# Patient Record
Sex: Female | Born: 1960 | Race: White | Hispanic: No | Marital: Married | Smoking: Never smoker
Health system: Southern US, Community
[De-identification: ages and names within clinical notes are randomized; demographics above are authoritative.]

## PROBLEM LIST (undated history)

## (undated) DIAGNOSIS — I10 Essential (primary) hypertension: Secondary | ICD-10-CM

## (undated) DIAGNOSIS — T7840XA Allergy, unspecified, initial encounter: Secondary | ICD-10-CM

## (undated) DIAGNOSIS — R51 Headache: Secondary | ICD-10-CM

## (undated) DIAGNOSIS — Z87440 Personal history of urinary (tract) infections: Secondary | ICD-10-CM

## (undated) DIAGNOSIS — D649 Anemia, unspecified: Secondary | ICD-10-CM

## (undated) DIAGNOSIS — I4891 Unspecified atrial fibrillation: Secondary | ICD-10-CM

## (undated) DIAGNOSIS — E785 Hyperlipidemia, unspecified: Secondary | ICD-10-CM

## (undated) DIAGNOSIS — F419 Anxiety disorder, unspecified: Secondary | ICD-10-CM

## (undated) DIAGNOSIS — E049 Nontoxic goiter, unspecified: Secondary | ICD-10-CM

## (undated) DIAGNOSIS — K219 Gastro-esophageal reflux disease without esophagitis: Secondary | ICD-10-CM

## (undated) DIAGNOSIS — M1990 Unspecified osteoarthritis, unspecified site: Secondary | ICD-10-CM

## (undated) DIAGNOSIS — M4800 Spinal stenosis, site unspecified: Secondary | ICD-10-CM

## (undated) HISTORY — DX: Allergy, unspecified, initial encounter: T78.40XA

## (undated) HISTORY — DX: Anxiety disorder, unspecified: F41.9

## (undated) HISTORY — DX: Spinal stenosis, site unspecified: M48.00

## (undated) HISTORY — PX: OTHER SURGICAL HISTORY: SHX169

## (undated) HISTORY — DX: Essential (primary) hypertension: I10

## (undated) HISTORY — DX: Gastro-esophageal reflux disease without esophagitis: K21.9

## (undated) HISTORY — DX: Unspecified atrial fibrillation: I48.91

## (undated) HISTORY — DX: Personal history of urinary (tract) infections: Z87.440

## (undated) HISTORY — DX: Nontoxic goiter, unspecified: E04.9

## (undated) HISTORY — DX: Headache: R51

## (undated) HISTORY — DX: Hyperlipidemia, unspecified: E78.5

## (undated) HISTORY — DX: Anemia, unspecified: D64.9

## (undated) HISTORY — DX: Unspecified osteoarthritis, unspecified site: M19.90

---

## 1997-11-04 ENCOUNTER — Ambulatory Visit (HOSPITAL_COMMUNITY): Admission: RE | Admit: 1997-11-04 | Discharge: 1997-11-04 | Payer: Self-pay | Admitting: Obstetrics and Gynecology

## 1998-02-04 ENCOUNTER — Ambulatory Visit (HOSPITAL_COMMUNITY): Admission: RE | Admit: 1998-02-04 | Discharge: 1998-02-04 | Payer: Self-pay | Admitting: Obstetrics and Gynecology

## 1998-03-31 ENCOUNTER — Ambulatory Visit (HOSPITAL_COMMUNITY): Admission: RE | Admit: 1998-03-31 | Discharge: 1998-03-31 | Payer: Self-pay | Admitting: Obstetrics and Gynecology

## 1998-04-06 ENCOUNTER — Ambulatory Visit (HOSPITAL_COMMUNITY): Admission: RE | Admit: 1998-04-06 | Discharge: 1998-04-06 | Payer: Self-pay | Admitting: Obstetrics and Gynecology

## 1998-04-08 ENCOUNTER — Ambulatory Visit (HOSPITAL_COMMUNITY): Admission: RE | Admit: 1998-04-08 | Discharge: 1998-04-08 | Payer: Self-pay | Admitting: Obstetrics and Gynecology

## 1998-07-01 ENCOUNTER — Other Ambulatory Visit: Admission: RE | Admit: 1998-07-01 | Discharge: 1998-07-01 | Payer: Self-pay | Admitting: Obstetrics and Gynecology

## 1999-09-23 ENCOUNTER — Other Ambulatory Visit: Admission: RE | Admit: 1999-09-23 | Discharge: 1999-09-23 | Payer: Self-pay | Admitting: Obstetrics and Gynecology

## 2000-12-06 ENCOUNTER — Other Ambulatory Visit: Admission: RE | Admit: 2000-12-06 | Discharge: 2000-12-06 | Payer: Self-pay | Admitting: Obstetrics and Gynecology

## 2001-07-25 ENCOUNTER — Encounter: Payer: Self-pay | Admitting: Family Medicine

## 2001-07-25 ENCOUNTER — Ambulatory Visit (HOSPITAL_COMMUNITY): Admission: RE | Admit: 2001-07-25 | Discharge: 2001-07-25 | Payer: Self-pay | Admitting: Family Medicine

## 2001-12-24 ENCOUNTER — Other Ambulatory Visit: Admission: RE | Admit: 2001-12-24 | Discharge: 2001-12-24 | Payer: Self-pay | Admitting: Obstetrics and Gynecology

## 2002-12-31 ENCOUNTER — Other Ambulatory Visit: Admission: RE | Admit: 2002-12-31 | Discharge: 2002-12-31 | Payer: Self-pay | Admitting: Obstetrics and Gynecology

## 2004-02-22 ENCOUNTER — Other Ambulatory Visit: Admission: RE | Admit: 2004-02-22 | Discharge: 2004-02-22 | Payer: Self-pay | Admitting: Obstetrics and Gynecology

## 2004-08-07 ENCOUNTER — Emergency Department (HOSPITAL_COMMUNITY): Admission: EM | Admit: 2004-08-07 | Discharge: 2004-08-08 | Payer: Self-pay | Admitting: Emergency Medicine

## 2005-03-14 ENCOUNTER — Other Ambulatory Visit: Admission: RE | Admit: 2005-03-14 | Discharge: 2005-03-14 | Payer: Self-pay | Admitting: Obstetrics and Gynecology

## 2005-09-12 ENCOUNTER — Ambulatory Visit: Payer: Self-pay | Admitting: Internal Medicine

## 2006-06-29 ENCOUNTER — Ambulatory Visit: Payer: Self-pay | Admitting: Internal Medicine

## 2006-06-29 ENCOUNTER — Ambulatory Visit: Payer: Self-pay

## 2006-09-19 ENCOUNTER — Ambulatory Visit: Payer: Self-pay | Admitting: Internal Medicine

## 2006-09-19 LAB — CONVERTED CEMR LAB
Creatinine, Ser: 0.7 mg/dL (ref 0.4–1.2)
Potassium: 3.8 meq/L (ref 3.5–5.1)

## 2007-05-07 ENCOUNTER — Ambulatory Visit: Payer: Self-pay | Admitting: Internal Medicine

## 2007-05-08 LAB — CONVERTED CEMR LAB
ALT: 28 units/L (ref 0–35)
BUN: 14 mg/dL (ref 6–23)
Bilirubin, Direct: 0.1 mg/dL (ref 0.0–0.3)
Calcium: 9.3 mg/dL (ref 8.4–10.5)
Eosinophils Absolute: 0.2 10*3/uL (ref 0.0–0.6)
GFR calc Af Amer: 98 mL/min
GFR calc non Af Amer: 81 mL/min
Glucose, Bld: 101 mg/dL — ABNORMAL HIGH (ref 70–99)
HDL: 39.3 mg/dL (ref >39.0)
Hgb A1c MFr Bld: 5.6 % (ref 4.6–6.0)
Lymphocytes Relative: 22 % (ref 12.0–46.0)
MCHC: 33.9 g/dL (ref 30.0–36.0)
MCV: 84.8 fL (ref 78.0–100.0)
Monocytes Relative: 5.9 % (ref 3.0–11.0)
Neutro Abs: 5.2 10*3/uL (ref 1.4–7.7)
Platelets: 320 10*3/uL (ref 150–400)
Saturation Ratios: 8 % — ABNORMAL LOW (ref 20.0–50.0)
Triglycerides: 160 mg/dL — ABNORMAL HIGH (ref 0–149)

## 2007-07-25 HISTORY — PX: OTHER SURGICAL HISTORY: SHX169

## 2008-05-05 ENCOUNTER — Ambulatory Visit: Payer: Self-pay | Admitting: Internal Medicine

## 2008-05-05 LAB — CONVERTED CEMR LAB
Albumin: 3.9 g/dL (ref 3.5–5.2)
Alkaline Phosphatase: 66 units/L (ref 39–117)
BUN: 15 mg/dL (ref 6–23)
Calcium: 9.1 mg/dL (ref 8.4–10.5)
Creatinine, Ser: 0.7 mg/dL (ref 0.4–1.2)
Creatinine,U: 110 mg/dL
GFR calc Af Amer: 114 mL/min
GFR calc non Af Amer: 95 mL/min
Microalb Creat Ratio: 174.5 mg/g — ABNORMAL HIGH (ref 0.0–30.0)
Microalb, Ur: 19.2 mg/dL — ABNORMAL HIGH (ref 0.0–1.9)
Potassium: 4.4 meq/L (ref 3.5–5.1)

## 2008-10-02 ENCOUNTER — Ambulatory Visit: Payer: Self-pay | Admitting: Internal Medicine

## 2008-10-02 LAB — CONVERTED CEMR LAB
ALT: 29 units/L (ref 0–35)
AST: 22 units/L (ref 0–37)
Eosinophils Relative: 3.9 % (ref 0.0–5.0)
Iron: 75 ug/dL (ref 42–145)
Lymphocytes Relative: 23.5 % (ref 12.0–46.0)
Monocytes Relative: 7.6 % (ref 3.0–12.0)
Neutrophils Relative %: 64.7 % (ref 43.0–77.0)
Platelets: 283 10*3/uL (ref 150–400)
WBC: 7.9 10*3/uL (ref 4.5–10.5)

## 2008-12-17 ENCOUNTER — Encounter: Payer: Self-pay | Admitting: Internal Medicine

## 2009-01-11 ENCOUNTER — Encounter: Payer: Self-pay | Admitting: Internal Medicine

## 2009-01-12 ENCOUNTER — Ambulatory Visit: Payer: Self-pay | Admitting: Internal Medicine

## 2009-01-12 DIAGNOSIS — E785 Hyperlipidemia, unspecified: Secondary | ICD-10-CM | POA: Insufficient documentation

## 2009-01-12 DIAGNOSIS — Z780 Asymptomatic menopausal state: Secondary | ICD-10-CM | POA: Insufficient documentation

## 2009-01-12 DIAGNOSIS — J309 Allergic rhinitis, unspecified: Secondary | ICD-10-CM | POA: Insufficient documentation

## 2009-01-12 DIAGNOSIS — R51 Headache: Secondary | ICD-10-CM | POA: Insufficient documentation

## 2009-01-12 DIAGNOSIS — D649 Anemia, unspecified: Secondary | ICD-10-CM | POA: Insufficient documentation

## 2009-01-12 DIAGNOSIS — R519 Headache, unspecified: Secondary | ICD-10-CM | POA: Insufficient documentation

## 2009-01-12 DIAGNOSIS — I10 Essential (primary) hypertension: Secondary | ICD-10-CM | POA: Insufficient documentation

## 2009-01-12 DIAGNOSIS — K219 Gastro-esophageal reflux disease without esophagitis: Secondary | ICD-10-CM | POA: Insufficient documentation

## 2009-01-12 DIAGNOSIS — M1990 Unspecified osteoarthritis, unspecified site: Secondary | ICD-10-CM | POA: Insufficient documentation

## 2009-01-12 LAB — CONVERTED CEMR LAB
HDL goal, serum: 40 mg/dL
LDL Goal: 130 mg/dL

## 2009-01-20 DIAGNOSIS — E049 Nontoxic goiter, unspecified: Secondary | ICD-10-CM | POA: Insufficient documentation

## 2009-02-17 ENCOUNTER — Encounter: Admission: RE | Admit: 2009-02-17 | Discharge: 2009-02-17 | Payer: Self-pay | Admitting: Internal Medicine

## 2009-02-23 ENCOUNTER — Other Ambulatory Visit: Admission: RE | Admit: 2009-02-23 | Discharge: 2009-02-23 | Payer: Self-pay | Admitting: Interventional Radiology

## 2009-02-23 ENCOUNTER — Encounter (INDEPENDENT_AMBULATORY_CARE_PROVIDER_SITE_OTHER): Payer: Self-pay | Admitting: Interventional Radiology

## 2009-02-23 ENCOUNTER — Encounter: Admission: RE | Admit: 2009-02-23 | Discharge: 2009-02-23 | Payer: Self-pay | Admitting: Internal Medicine

## 2009-03-01 ENCOUNTER — Encounter: Payer: Self-pay | Admitting: Internal Medicine

## 2009-03-01 ENCOUNTER — Telehealth: Payer: Self-pay | Admitting: *Deleted

## 2009-03-11 ENCOUNTER — Ambulatory Visit: Payer: Self-pay | Admitting: Internal Medicine

## 2009-03-15 ENCOUNTER — Ambulatory Visit: Payer: Self-pay | Admitting: Internal Medicine

## 2009-03-15 DIAGNOSIS — L03317 Cellulitis of buttock: Secondary | ICD-10-CM | POA: Insufficient documentation

## 2009-03-15 DIAGNOSIS — L0231 Cutaneous abscess of buttock: Secondary | ICD-10-CM | POA: Insufficient documentation

## 2009-03-17 LAB — CONVERTED CEMR LAB
ALT: 26 units/L (ref 0–35)
Albumin: 3.7 g/dL (ref 3.5–5.2)
HDL: 43.5 mg/dL (ref >39.00)
Total Bilirubin: 0.8 mg/dL (ref 0.3–1.2)
Total CHOL/HDL Ratio: 4
Triglycerides: 133 mg/dL (ref 0.0–149.0)
VLDL: 26.6 mg/dL (ref 0.0–40.0)

## 2009-03-29 ENCOUNTER — Encounter: Payer: Self-pay | Admitting: Internal Medicine

## 2009-04-14 ENCOUNTER — Ambulatory Visit (HOSPITAL_COMMUNITY): Admission: RE | Admit: 2009-04-14 | Discharge: 2009-04-14 | Payer: Self-pay | Admitting: Endocrinology

## 2009-07-09 ENCOUNTER — Ambulatory Visit: Payer: Self-pay | Admitting: Internal Medicine

## 2009-07-09 LAB — CONVERTED CEMR LAB
ALT: 30 units/L (ref 0–35)
AST: 21 units/L (ref 0–37)
Albumin: 4.2 g/dL (ref 3.5–5.2)
Bilirubin, Direct: 0.1 mg/dL (ref 0.0–0.3)
CO2: 25 meq/L (ref 19–32)
Calcium: 9.4 mg/dL (ref 8.4–10.5)
Cholesterol: 182 mg/dL (ref 0–200)
Creatinine, Urine: 95.6 mg/dL
Glucose, Bld: 102 mg/dL — ABNORMAL HIGH (ref 70–99)
HDL: 46 mg/dL (ref >39)
Potassium: 4.8 meq/L (ref 3.5–5.3)
Sodium: 140 meq/L (ref 135–145)
Total CHOL/HDL Ratio: 4
Total Protein, Urine: 66
Total Protein: 7 g/dL (ref 6.0–8.3)
VLDL: 33 mg/dL (ref 0–40)

## 2009-07-13 ENCOUNTER — Ambulatory Visit: Payer: Self-pay | Admitting: Internal Medicine

## 2009-07-13 LAB — CONVERTED CEMR LAB: T4, Total: 8.2 ug/dL (ref 5.0–12.5)

## 2009-07-14 ENCOUNTER — Encounter: Payer: Self-pay | Admitting: Internal Medicine

## 2009-07-14 ENCOUNTER — Encounter (INDEPENDENT_AMBULATORY_CARE_PROVIDER_SITE_OTHER): Payer: Self-pay | Admitting: *Deleted

## 2009-09-27 ENCOUNTER — Encounter: Admission: RE | Admit: 2009-09-27 | Discharge: 2009-09-27 | Payer: Self-pay | Admitting: Internal Medicine

## 2009-10-07 ENCOUNTER — Encounter: Payer: Self-pay | Admitting: Internal Medicine

## 2009-10-13 ENCOUNTER — Encounter: Payer: Self-pay | Admitting: Internal Medicine

## 2009-11-26 ENCOUNTER — Encounter: Payer: Self-pay | Admitting: *Deleted

## 2009-12-29 ENCOUNTER — Encounter: Admission: RE | Admit: 2009-12-29 | Discharge: 2009-12-29 | Payer: Self-pay | Admitting: Endocrinology

## 2010-04-06 ENCOUNTER — Encounter: Admission: RE | Admit: 2010-04-06 | Discharge: 2010-04-06 | Payer: Self-pay | Admitting: Obstetrics and Gynecology

## 2010-07-14 ENCOUNTER — Ambulatory Visit: Payer: Self-pay | Admitting: Internal Medicine

## 2010-07-14 LAB — CONVERTED CEMR LAB
AST: 23 units/L (ref 0–37)
BUN: 18 mg/dL (ref 6–23)
Basophils Absolute: 0 10*3/uL (ref 0.0–0.1)
Calcium: 9.8 mg/dL (ref 8.4–10.5)
Chloride: 106 meq/L (ref 96–112)
Creatinine, Ser: 0.7 mg/dL (ref 0.4–1.2)
Creatinine, Urine: 63.3 mg/dL
Eosinophils Relative: 3.1 % (ref 0.0–5.0)
GFR calc non Af Amer: 101.89 mL/min (ref >60)
HCT: 36.2 % (ref 36.0–46.0)
HDL: 46.6 mg/dL (ref >39.00)
LDL Cholesterol: 94 mg/dL (ref 0–99)
Lymphs Abs: 1.8 10*3/uL (ref 0.7–4.0)
Monocytes Relative: 7.2 % (ref 3.0–12.0)
Neutrophils Relative %: 62.1 % (ref 43.0–77.0)
Platelets: 307 10*3/uL (ref 150.0–400.0)
RDW: 14.4 % (ref 11.5–14.6)
TSH: 0.79 microintl units/mL (ref 0.35–5.50)
Total CHOL/HDL Ratio: 4
Total Protein, Urine: 26
Triglycerides: 138 mg/dL (ref 0.0–149.0)
VLDL: 27.6 mg/dL (ref 0.0–40.0)
WBC: 6.8 10*3/uL (ref 4.5–10.5)

## 2010-07-19 ENCOUNTER — Ambulatory Visit: Payer: Self-pay | Admitting: Internal Medicine

## 2010-09-04 ENCOUNTER — Inpatient Hospital Stay (HOSPITAL_COMMUNITY)
Admission: EM | Admit: 2010-09-04 | Discharge: 2010-09-05 | Payer: Self-pay | Source: Home / Self Care | Attending: Internal Medicine | Admitting: Internal Medicine

## 2010-09-07 ENCOUNTER — Ambulatory Visit (HOSPITAL_COMMUNITY)
Admission: RE | Admit: 2010-09-07 | Discharge: 2010-09-07 | Payer: Self-pay | Source: Home / Self Care | Attending: Cardiology | Admitting: Cardiology

## 2010-09-07 ENCOUNTER — Ambulatory Visit: Admission: RE | Admit: 2010-09-07 | Discharge: 2010-09-07 | Payer: Self-pay | Source: Home / Self Care

## 2010-09-07 ENCOUNTER — Encounter: Payer: Self-pay | Admitting: Internal Medicine

## 2010-09-07 ENCOUNTER — Other Ambulatory Visit: Payer: Self-pay

## 2010-09-16 DIAGNOSIS — I4891 Unspecified atrial fibrillation: Secondary | ICD-10-CM | POA: Insufficient documentation

## 2010-09-19 ENCOUNTER — Ambulatory Visit
Admission: RE | Admit: 2010-09-19 | Discharge: 2010-09-19 | Payer: Self-pay | Source: Home / Self Care | Attending: Internal Medicine | Admitting: Internal Medicine

## 2010-09-19 ENCOUNTER — Encounter: Payer: Self-pay | Admitting: Internal Medicine

## 2010-09-25 ENCOUNTER — Encounter: Payer: Self-pay | Admitting: Endocrinology

## 2010-09-26 ENCOUNTER — Other Ambulatory Visit: Payer: Self-pay

## 2010-09-26 ENCOUNTER — Encounter: Payer: Self-pay | Admitting: Internal Medicine

## 2010-09-26 DIAGNOSIS — Z1239 Encounter for other screening for malignant neoplasm of breast: Secondary | ICD-10-CM

## 2010-09-30 NOTE — Discharge Summary (Signed)
Samantha Boone, Samantha Boone NO.:  [id number]  MEDICAL RECORD NO.:  [id number]          PATIENT TYPE:  INP  LOCATION:  3730                         FACILITY:  [facility]  PHYSICIAN:  Pricilla Riffle, MD, FACCDATE OF BIRTH:  November 01, 1960  DATE OF ADMISSION:  09/04/2010 DATE OF DISCHARGE:  09/05/2010                              DISCHARGE SUMMARY   PRIMARY CARDIOLOGIST:  Pricilla Riffle, MD, [facility]  PRIMARY CARE PHYSICIAN:  Stacie Glaze, MD  PROCEDURES PERFORMED DURING HOSPITALIZATION:  None.  FINAL DISCHARGE DIAGNOSIS:  Paroxysmal atrial fibrillation.  SECONDARY DIAGNOSES: 1. Osteoarthritis. 2. Hypertension. 3. Hyperlipidemia. 4. Gastroesophageal reflux disease. 5. History of goiter.  HOSPITAL COURSE:  This is a 50 year old female who presented to the emergency room with atrial fib with RVR.  It was her first episode of atrial fibrillation.  She was started on a Cardizem drip and was also started on metoprolol for rate control and she was given a bolus of magnesium.  Cardiac markers were cycled and found to be negative.  The patient was followed by Dr. Tenny Craw the following day and she had converted to normal sinus rhythm.  She was started on Cardizem 240 mg daily and metoprolol was discontinued.  The patient was without complaint of chest pain or shortness of breath.  She was not placed on Coumadin at that time per Dr. Tenny Craw that may be considered for same.  She does have a CHADS score of 1 at this time and is therefore being treated with aspirin only.  Echocardiogram was not completed during hospitalization. This can be reevaluated as an outpatient per Dr. Charlott Rakes discretion.  The patient is currently in normal sinus rhythm with stable blood pressure of 106/70.  She will follow up with Dr. Tenny Craw as an outpatient in our clinic within 2 weeks.  We will give her an appointment as this is a weekend and they will call her.  DISCHARGE LABORATORY DATA:  Cholesterol 171,  triglycerides 179, HDL 39, and LDL 96.  Sodium 138, potassium 3.7, chloride 106, CO2 of 25, glucose 99, BUN 16, and creatinine 0.66.  Hemoglobin 12.0, hematocrit 36.5, white blood cells 8.8, and platelets 303.  T4 of 1.14 and TSH is 0.844. Magnesium 2.1.  BNP 163.0.  Chest x-ray dated September 04, 2010, revealing mild cardiomegaly.  VITAL SIGNS ON DISCHARGE:  Blood pressure 106/70, pulse 67, respirations 18, O2 sat 96% on room air, her temperature was 98.1, and weight 132.3 kg.  DISCHARGE MEDICATIONS: 1. Cardizem 240 mg by mouth daily (called in by Dr. Tenny Craw to her     pharmacy). 2. Aspirin 81 mg daily. 3. Black cohosh root extract 40 mg b.i.d. 4. Calcium citrate 1 tablet by mouth b.i.d. 5. Chlor-Trimeton 4 mg by mouth daily as needed. 6. Cozaar 100 mg by mouth b.i.d. 7. Zetia 10 mg by mouth every evening. 8. Flonase nasal spray 1 spray every morning. 9. Glucosamine sulfate/chondroitin 1 tablet by mouth b.i.d. 10.Krill oil 1000 mg capsules by mouth b.i.d. 11.Mobic 15 mg every morning. 12.Multivitamins 1 tablet by mouth daily. 13.Nexium 1 capsule by mouth daily at 40 mg. 14.Pseudoephedrine 30 mg 2 tablets  by mouth every 12 hours p.r.n. 15.Red yeast rice 600 mg my mouth twice daily. 16.Vitamin D2 500 units by mouth on Sundays.  FOLLOWUP PLANS AND APPOINTMENT: 1. The patient will follow up with Dr. Dietrich Pates in the [facility].  She will be called to have this appointment made as this is     a holiday. 2. She will follow up with Dr. Darryll Capers, her primary care     physician for continued medical management. 3. She has been advised to take a new medication, Cardizem which has     been called in. 4. She has been advised to bring all medications to followup     appointment.  Time spent with the patient to include physician time 35 minutes.     [name]. Lyman Bishop, NP   ______________________________ Pricilla Riffle, MD, [facility]    KML/MEDQ  D:  09/05/2010  T:   09/05/2010  Job:  [id number]  cc:   Stacie Glaze, MD  Electronically Signed by Joni Reining NP on 09/06/2010 04:24:31 PM Electronically Signed by Dietrich Pates MD [facility] on 09/30/2010 07:54:49 PM

## 2010-10-06 NOTE — Assessment & Plan Note (Signed)
Summary: 5 month rov/njr pt rsc/njr---PT [facility] // RS PT RSC/NJR/PT RSC/CJR   Vital Signs:  Patient profile:   50 year old female Height:      64.25 inches Weight:      292 pounds BMI:     49.91 Temp:     98.2 degrees F oral Pulse rate:   76 / minute Resp:     14 per minute BP sitting:   142 / 84  (left arm) Cuff size:   large  Vitals Entered By: Willy Eddy, LPN (July 19, 2010 3:51 PM) CC: roa labs, Hypertension Management Is Patient Diabetic? No   CC:  roa labs and Hypertension Management.  Hypertension History:      She denies headache, chest pain, palpitations, dyspnea with exertion, orthopnea, PND, peripheral edema, visual symptoms, neurologic problems, syncope, and side effects from treatment.  She notes no problems with any antihypertensive medication side effects.  has lost weight.        Positive major cardiovascular risk factors include hyperlipidemia and hypertension.  Negative major cardiovascular risk factors include female age less than 37 years old and non-tobacco-user status.     Preventive Screening-Counseling & Management  Alcohol-Tobacco     Smoking Status: never     Tobacco Counseling: not indicated; no tobacco use  Problems Prior to Update: 1)  Cellulitis and Abscess of Buttock  (ICD-682.5) 2)  Goiter  (ICD-240.9) 3)  Weight Gain  (ICD-783.1) 4)  Climacteric State, Female  (ICD-V49.81) 5)  Osteoarthritis  (ICD-715.90) 6)  Hypertension  (ICD-401.9) 7)  Hyperlipidemia  (ICD-272.4) 8)  Headache  (ICD-784.0) 9)  Gerd  (ICD-530.81) 10)  Anemia-nos  (ICD-285.9) 11)  Allergic Rhinitis  (ICD-477.9)  Current Problems (verified): 1)  Cellulitis and Abscess of Buttock  (ICD-682.5) 2)  Goiter  (ICD-240.9) 3)  Weight Gain  (ICD-783.1) 4)  Climacteric State, Female  (ICD-V49.81) 5)  Osteoarthritis  (ICD-715.90) 6)  Hypertension  (ICD-401.9) 7)  Hyperlipidemia  (ICD-272.4) 8)  Headache  (ICD-784.0) 9)  Gerd  (ICD-530.81) 10)  Anemia-nos   (ICD-285.9) 11)  Allergic Rhinitis  (ICD-477.9)  Medications Prior to Update: 1)  Cozaar 100 Mg Tabs (Losartan Potassium) .Marland Kitchen.. 1 Two Times A Dayname Brand Only 2)  Bayer Aspirin Ec Low Dose 81 Mg Tbec (Aspirin) .Marland Kitchen.. 1 Once Daily 3)  Nexium 40 Mg Cpdr (Esomeprazole Magnesium) .Marland Kitchen.. 1 Once Daily 4)  Tums Ultra 1000 1000 Mg Chew (Calcium Carbonate Antacid) .Marland Kitchen.. 1 Two Times A Day 5)  Multivitamins  Caps (Multiple Vitamin) .Marland Kitchen.. 1 Once Daily 6)  Flonase 50 Mcg/act Susp (Fluticasone Propionate) .... Each Nostril Once Daily 7)  Anaprox Ds 550 Mg Tabs (Naproxen Sodium) .... As Needed 8)  Sudafed 30 Mg Tabs (Pseudoephedrine Hcl) .... 2 As Needed 9)  Glucosamine-Chondroitin  Caps (Glucosamine-Chondroit-Vit C-Mn) .Marland KitchenMarland KitchenMarland Kitchen 1500/1200 1 Two Times A Day 10)  D3-50 50000 Unit Caps (Cholecalciferol) .Marland Kitchen.. 1 Every Week 11)  Remifemin 20 Mg Tabs (Black Cohosh) .... One By Mouth Two Times A Day 12)  Krill Oil 1000 Mg Caps (Krill Oil) .Marland Kitchen.. 1 Once Daily 13)  Zetia 10 Mg Tabs (Ezetimibe) .... 1/2 Once Daily 14)  Red Yeast Rice 600 Mg Caps (Red Yeast Rice Extract) .Marland Kitchen.. 1 Two Times A Day 15)  Chlor-Trimeton 4 Mg Tabs (Chlorpheniramine Maleate) .Marland Kitchen.. 1 Once Daily As Needed 16)  Afrin Nasal Spray 0.05 % Soln (Oxymetazoline Hcl) .Marland Kitchen.. 1 Once Daily 17)  Urelle 81 Mg Tabs (Meth-Hyo-M Bl-Na Phos-Ph Sal) .... One By Mouth Q 6  Hour Prn 18)  Doxycycline Hyclate 100 Mg Caps (Doxycycline Hyclate) .Marland Kitchen.. 1 Two Times A Day For 10 Days  Current Medications (verified): 1)  Cozaar 100 Mg Tabs (Losartan Potassium) .Marland Kitchen.. 1 Two Times A Dayname Brand Only 2)  Bayer Aspirin Ec Low Dose 81 Mg Tbec (Aspirin) .Marland Kitchen.. 1 Once Daily 3)  Nexium 40 Mg Cpdr (Esomeprazole Magnesium) .Marland Kitchen.. 1 Once Daily 4)  Multivitamins  Caps (Multiple Vitamin) .Marland Kitchen.. 1 Once Daily 5)  Flonase 50 Mcg/act Susp (Fluticasone Propionate) .... Each Nostril Once Daily 6)  Anaprox Ds 550 Mg Tabs (Naproxen Sodium) .... As Needed 7)  Sudafed 30 Mg Tabs (Pseudoephedrine Hcl) .... 2 As  Needed 8)  Glucosamine-Chondroitin  Caps (Glucosamine-Chondroit-Vit C-Mn) .Marland KitchenMarland KitchenMarland Kitchen 1500/1200 1 Two Times A Day 9)  D3-50 50000 Unit Caps (Cholecalciferol) .Marland Kitchen.. 1 Every Week 10)  Krill Oil 1000 Mg Caps (Krill Oil) .Marland Kitchen.. 1 Once Daily 11)  Zetia 10 Mg Tabs (Ezetimibe) .... 1/2 Once Daily 12)  Red Yeast Rice 600 Mg Caps (Red Yeast Rice Extract) .Marland Kitchen.. 1 Two Times A Day 13)  Chlor-Trimeton 4 Mg Tabs (Chlorpheniramine Maleate) .Marland Kitchen.. 1 Once Daily As Needed 14)  Urelle 81 Mg Tabs (Meth-Hyo-M Bl-Na Phos-Ph Sal) .... One By Mouth Q 6 Hour Prn 15)  Doxycycline Hyclate 100 Mg Caps (Doxycycline Hyclate) .Marland Kitchen.. 1 Two Times A Day For 10 Days 16)  Caltrate 600 1500 Mg Tabs (Calcium Carbonate) .... 2 Once Daily 17)  Meloxicam 15 Mg Tabs (Meloxicam) .... 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  Past Medical History: Reviewed history from 01/12/2009 and no changes required. Allergic rhinitis Anemia-NOS GERD Headache Hyperlipidemia Hypertension Osteoarthritis hx of uti  Past Surgical History: Reviewed history from 01/12/2009 and no changes required. fibroid   removal 1993 uterine ablation 2008  Family History: Reviewed history from 01/12/2009 and no changes required. Family History of Arthritis Family History Hypertension Family History of Prostate CA 1st degree relative <50  Social History: Reviewed history from 01/12/2009 and no changes required. 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         Flu Vaccine Consent Questions     Do you have a history of severe allergic reactions to this vaccine? no    Any prior history of allergic reactions to egg and/or gelatin? no    Do you have a sensitivity to the preservative Thimersol? no    Do you have a past history of Guillan-Barre Syndrome? no    Do you currently have an acute febrile illness? no    Have you ever had a severe reaction to latex? no    Vaccine information given and explained to patient? yes    Are you currently pregnant? no  Lot Number:[id number]   Exp Date:03/04/2011   Site Given  Left Deltoid IM   Physical Exam  General:  alert and well-hydrated.  overweight-appearing.   Eyes:  pupils equal and pupils round.   Nose:  External nasal examination shows no deformity or inflammation. Nasal mucosa are pink and moist without lesions or exudates. Neck:  supple and L neck mass.   Lungs:  normal respiratory effort and normal breath sounds.   Heart:  normal rate and regular rhythm.   Abdomen:  Bowel sounds positive,abdomen soft and non-tender without masses, organomegaly or hernias noted.   Impression & Recommendations:  Problem # 1:  GOITER (ICD-240.9) monitering thyroid  tests and exam  Problem # 2:  HYPERTENSION (ICD-401.9)  up due to the use of naprosyn Her updated medication list for this problem includes:    Cozaar 100 Mg Tabs (Losartan potassium) .Marland Kitchen... 1 two times a dayname brand  only  BP today: 142/84 Prior BP: 126/82 (07/13/2009)  Prior 10 Yr Risk Heart Disease: 8 % (07/13/2009)  Labs Reviewed: K+: 4.8 (07/14/2010) Creat: : 0.7 (07/14/2010)   Chol: 168 (07/14/2010)   HDL: 46.60 (07/14/2010)   LDL: 94 (07/14/2010)   TG: 138.0 (07/14/2010)  Problem # 3:  OSTEOARTHRITIS (ICD-715.90)  Her updated medication list for this problem includes:    Bayer Aspirin Ec Low Dose 81 Mg Tbec (Aspirin) .Marland Kitchen... 1 once daily    Anaprox Ds 550 Mg Tabs (Naproxen sodium) .Marland Kitchen... As needed    Meloxicam 15 Mg Tabs (Meloxicam) ..... One by mouth daily  Discussed use of medications, application of heat or cold, and exercises.   Complete Medication List: 1)  Cozaar 100 Mg Tabs (Losartan potassium) .Marland Kitchen.. 1 two times a dayname brand only 2)  Bayer Aspirin Ec Low Dose 81 Mg Tbec (Aspirin) .Marland Kitchen.. 1 once daily 3)  Nexium 40 Mg Cpdr (Esomeprazole magnesium) .Marland Kitchen.. 1 once daily 4)  Multivitamins Caps (Multiple vitamin) .Marland Kitchen.. 1 once daily 5)  Flonase 50 Mcg/act Susp (Fluticasone propionate) .... Each nostril once daily 6)  Anaprox Ds 550 Mg Tabs (Naproxen sodium) .... As needed 7)  Sudafed 30 Mg Tabs (Pseudoephedrine hcl) .... 2 as needed 8)  Glucosamine-chondroitin Caps (Glucosamine-chondroit-vit c-mn) .Marland KitchenMarland KitchenMarland Kitchen 1500/1200 1 two times a day 9)  D3-50 50000 Unit Caps (Cholecalciferol) .Marland Kitchen.. 1 every week 10)  Krill Oil 1000 Mg Caps (Krill oil) .Marland Kitchen.. 1 once daily 11)  Zetia 10 Mg Tabs (Ezetimibe) .... 1/2 once daily 12)  Red Yeast Rice 600 Mg Caps (Red yeast rice extract) .Marland Kitchen.. 1 two times a day 13)  Chlor-trimeton 4 Mg Tabs (Chlorpheniramine maleate) .Marland Kitchen.. 1 once daily as needed 14)  Urelle 81 Mg Tabs (Meth-hyo-m bl-na phos-ph sal) .... One by mouth q 6 hour prn 15)  Doxycycline Hyclate 100 Mg Caps (Doxycycline hyclate) .Marland Kitchen.. 1 two times a day for 10 days 16)  Caltrate 600 1500 Mg Tabs (Calcium carbonate) .... 2 once daily 17)  Meloxicam 15 Mg Tabs (Meloxicam) .... One by mouth daily  Other Orders: Admin 1st  Vaccine (82956) Flu Vaccine 42yrs + [phone])  Hypertension Assessment/Plan:      The patient's hypertensive risk group is category B: At least one risk factor (excluding diabetes) with no target organ damage.  Her calculated 10 year risk of coronary heart disease is 8 %.  Today's blood pressure is 142/84.  Her blood pressure goal is < 140/90.  Patient Instructions: 1)  Please schedule a follow-up appointment in 6 months. 2)  call to set up  thyroid US prior if  ballan does not  Prescriptions: MELOXICAM 15 MG TABS (MELOXICAM) one by mouth daily  #90 x 3   Entered and Authorized by:   Stacie Glaze MD   Signed by:   Stacie Glaze MD on 07/19/2010   Method used:   Faxed to ...       [facility])             ,          Ph:        Fax: [phone]   RxID:   [id number]    Orders Added: 1)  Admin 1st Vaccine [90471] 2)  Flu Vaccine 82yrs + [31517] 3)  Est. Patient Level IV [61607]

## 2010-10-06 NOTE — Assessment & Plan Note (Addendum)
Summary: eph/a-fib-mb  Medications Added COZAAR 100 MG TABS (LOSARTAN POTASSIUM) Take 1/2 tablet daily ASPIRIN EC 325 MG TBEC (ASPIRIN) Take one tablet by mouth daily DILTIAZEM HCL ER BEADS 240 MG XR24H-CAP (DILTIAZEM HCL ER BEADS) Take one capsule by mouth daily METOPROLOL TARTRATE 25 MG TABS (METOPROLOL TARTRATE) one twice a day      Allergies Added: NKDA  Visit Type:  Initial Consult Primary Provider:  Stacie Glaze MD  CC:  Post-hospital A-fib.  History of Present Illness: Samantha Boone is a 50 year old with a history of hypertension.  She was recently admitted with atrial fibrillation with rapid response.  She was treated with IV dilitazem and converted on her own to SR. She is now on ASA and 240 Diltiazem CD.   SInce D/C she denies palpitations.  She has elim caffeine.  She says her breathing is better on the dilitazem.  BP has been trending up (she is also on Cozaar 50 mg) Echo was done last week and showed normal LV systolic function.  There was mod diastolic dysfunction.  Current Medications (verified): 1)  Cozaar 100 Mg Tabs (Losartan Potassium) .... Take 1/2 Tablet Daily 2)  Aspirin Ec 325 Mg Tbec (Aspirin) .... Take One Tablet By Mouth Daily 3)  Nexium 40 Mg Cpdr (Esomeprazole Magnesium) .Marland Kitchen.. 1 Once Daily 4)  Multivitamins  Caps (Multiple Vitamin) .Marland Kitchen.. 1 Once Daily 5)  Flonase 50 Mcg/act Susp (Fluticasone Propionate) .... Each Nostril Once Daily 6)  Glucosamine-Chondroitin  Caps (Glucosamine-Chondroit-Vit C-Mn) .Marland KitchenMarland KitchenMarland Kitchen 1500/1200 1 Two Times A Day 7)  D3-50 50000 Unit Caps (Cholecalciferol) .Marland Kitchen.. 1 Every Week 8)  Krill Oil 1000 Mg Caps (Krill Oil) .Marland Kitchen.. 1 Once Daily 9)  Zetia 10 Mg Tabs (Ezetimibe) .... 1/2 Once Daily 10)  Red Yeast Rice 600 Mg Caps (Red Yeast Rice Extract) .Marland Kitchen.. 1 Two Times A Day 11)  Chlor-Trimeton 4 Mg Tabs (Chlorpheniramine Maleate) .Marland Kitchen.. 1 Once Daily As Needed 12)  Urelle 81 Mg Tabs (Meth-Hyo-M Bl-Na Phos-Ph Sal) .... One By Mouth Q 6 Hour Prn 13)  Caltrate 600  1500 Mg Tabs (Calcium Carbonate) .... 2 Once Daily 14)  Meloxicam 15 Mg Tabs (Meloxicam) .... One By Mouth Daily 15)  Diltiazem Hcl Er Beads 240 Mg Xr24h-Cap (Diltiazem Hcl Er Beads) .... Take One Capsule By Mouth Daily  Allergies (verified): No Known Drug Allergies  Past History:  Past medical, surgical, family and social histories (including risk factors) reviewed, and no changes noted (except as noted below).  Past Medical History: Reviewed history from 09/16/2010 and no changes required. Allergic rhinitis Anemia-NOS GERD Headache Hyperlipidemia Hypertension Osteoarthritis hx of uti Atrial Fibrillation  Past Surgical History: Reviewed history from 01/12/2009 and no changes required. fibroid   removal 1993 uterine ablation 2008  Family History: Reviewed history from 01/12/2009 and no changes required. Family History of Arthritis Family History Hypertension Family History of Prostate CA 1st degree relative <50  Social History: Reviewed history from 01/12/2009 and no changes required. 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  Neg to the above problme.  Notes that she snores, always has.  Sleeps on stomach  Vital Signs:  Patient profile:   50 year old female Height:      64.25 inches Weight:      289 pounds BMI:     49.40 Pulse rhythm:   regular Resp:     18 per minute BP sitting:   140 /  90  (left arm) Cuff size:   large  Vitals Entered By: Vikki Ports (September 19, 2010 9:28 AM)  Physical Exam  Additional Exam:  Patient is in NAD HEENT:  Normocephalic, atraumatic. EOMI, PERRLA.  Neck: JVP is normal. No thyromegaly. No bruits.  Lungs: clear to auscultation. No rales no wheezes.  Heart: Regular rate and rhythm. Normal S1, S2. No S3.   No significant murmurs. PMI not displaced.  Abdomen:  Supple, nontender. Normal bowel sounds. No masses. No hepatomegaly.  Extremities:   Good distal  pulses throughout. No lower extremity edema.  Musculoskeletal :moving all extremities.  Neuro:   alert and oriented x3.    EKG  Procedure date:  09/19/2010  Findings:      NSR.  74 bpm.    Impression & Recommendations:  Problem # 1:  ATRIAL FIBRILLATION (ICD-427.31) No recurrence.  Continue current regimen  I would add Lopressor to take as needed if breakthrough.  Will also check on Flecanide does to take as needed. Only concern is that sleep apnea may be a trigger.  THis has never been diagonosed.  Will check on home testing.  Problem # 2:  HYPERTENSION (ICD-401.9) WOuld increase Cozaar back to 100mg  per day. Follow.  Again, consider sleep study.  Problem # 3:  HYPERLIPIDEMIA (ICD-272.4) ALso on red yeast rice and krill oil.  Will need to follow. Her updated medication list for this problem includes:    Zetia 10 Mg Tabs (Ezetimibe) .Marland Kitchen... 1/2 once daily  Problem # 4:  WEIGHT GAIN (ICD-783.1) COntinues to work on weight loss.  Other Orders: EKG w/ Interpretation (93000) Prescriptions: METOPROLOL TARTRATE 25 MG TABS (METOPROLOL TARTRATE) one twice a day  #180 x 3   Entered by:   Katina Dung, RN, BSN   Authorized by:   Sherrill Raring, MD, [facility]   Signed by:   Katina Dung, RN, BSN on 09/19/2010   Method used:   Faxed to ...       [facility] ([facility])             , [place]         Ph:        Fax: [phone]   RxID:   [id number] DILTIAZEM HCL ER BEADS 240 MG XR24H-CAP (DILTIAZEM HCL ER BEADS) Take one capsule by mouth daily  #90 x 3   Entered by:   Katina Dung, RN, BSN   Authorized by:   Sherrill Raring, MD, [facility]   Signed by:   Katina Dung, RN, BSN on 09/19/2010   Method used:   Faxed to ...       [facility] ([facility])             , [place]         Ph:        Fax: [phone]   RxID:   [id number]   Appended Document: eph/a-fib-mb Will add Flecanide 150 mg to take if afib recurs.  Can repeat in 2 hrs with an addtional 150 mg if  still in afib.

## 2010-10-06 NOTE — Letter (Signed)
Summary: Generic Letter  [place] [facility]  [address] [place], [place] [address]   Phone: [phone]  Fax: [phone]    11/26/2009  ARACELYS GLADE [address] [place], [place]  [address]    Please Be Advised:  Ms. Heyboer ,juror number [phone], is unable to sit on jury duty due to her hyptertension, which becomes problematic when she is under stress, as she would be if on jury duty.  Ms. Garbutt is also the sole caretaker of her 44 year old mother, and must be available for her at all times.    Please excuse Ms. Berntsen from jury duty.  We can not risk blood pressure elevation which could potenially cause serious illness.  Sincerely,   Dr. Darryll Capers

## 2010-10-06 NOTE — Miscellaneous (Signed)
Summary: Flecainide Rx  Clinical Lists Changes  Medications: Added new medication of FLECAINIDE ACETATE 50 MG TABS (FLECAINIDE ACETATE) take 3 tablets by mouth for AFIB, may repeat 3 tablets by mouth in 2 hours for AFIB - Signed Rx of FLECAINIDE ACETATE 50 MG TABS (FLECAINIDE ACETATE) take 3 tablets by mouth for AFIB, may repeat 3 tablets by mouth in 2 hours for AFIB;  #6 x 1;  Signed;  Entered by: Julieta Gutting, RN, BSN;  Authorized by: Sherrill Raring, MD, [facility];  Method used: Electronically to [facility]*, [address] [place], [place]  [address], Ph: [phone] or [phone], Fax: [phone]    Prescriptions: FLECAINIDE ACETATE 50 MG TABS (FLECAINIDE ACETATE) take 3 tablets by mouth for AFIB, may repeat 3 tablets by mouth in 2 hours for AFIB  #6 x 1   Entered by:   Julieta Gutting, RN, BSN   Authorized by:   Sherrill Raring, MD, [facility]   Signed by:   Julieta Gutting, RN, BSN on 09/26/2010   Method used:   Electronically to        [facility]* (retail)       [address] [place], [place]  [address]       Ph: [phone] or [phone]       Fax: [phone]   RxID:   [id number]

## 2010-10-06 NOTE — Miscellaneous (Signed)
Summary: [facility]  [facility]   Imported By: Maryln Gottron 10/26/2009 10:47:32  _____________________________________________________________________  External Attachment:    Type:   Image     Comment:   External Document

## 2010-10-06 NOTE — Miscellaneous (Signed)
Summary: Orders Update   Clinical Lists Changes  Orders: Added new Referral order of Physical Therapy Referral (PT) - Signed 

## 2010-10-19 ENCOUNTER — Ambulatory Visit
Admission: RE | Admit: 2010-10-19 | Discharge: 2010-10-19 | Disposition: A | Discharge status: Home / Self Care | Payer: BC Managed Care – PPO | Source: Ambulatory Visit | Attending: Internal Medicine | Admitting: Internal Medicine

## 2010-10-19 DIAGNOSIS — Z1239 Encounter for other screening for malignant neoplasm of breast: Secondary | ICD-10-CM

## 2010-11-14 LAB — POCT CARDIAC MARKERS
CKMB, poc: 7.8 ng/mL (ref 1.0–8.0)
Myoglobin, poc: 137 ng/mL (ref 12–200)
Troponin i, poc: <0.05 ng/mL (ref 0.00–0.09)

## 2010-11-14 LAB — BASIC METABOLIC PANEL
BUN: 16 mg/dL (ref 6–23)
CO2: 25 mEq/L (ref 19–32)
Chloride: 106 mEq/L (ref 96–112)
Creatinine, Ser: 0.66 mg/dL (ref 0.4–1.2)

## 2010-11-14 LAB — CBC
HCT: 36.5 % (ref 36.0–46.0)
MCH: 26.6 pg (ref 26.0–34.0)
MCHC: 32.3 g/dL (ref 30.0–36.0)
MCV: 83 fL (ref 78.0–100.0)
Platelets: 303 10*3/uL (ref 150–400)
Platelets: 303 10*3/uL (ref 150–400)
RBC: 4.4 MIL/uL (ref 3.87–5.11)
RBC: 4.73 MIL/uL (ref 3.87–5.11)
WBC: 8.8 10*3/uL (ref 4.0–10.5)

## 2010-11-14 LAB — LIPID PANEL
HDL: 39 mg/dL — ABNORMAL LOW (ref >39)
LDL Cholesterol: 96 mg/dL (ref 0–99)
Triglycerides: 179 mg/dL — ABNORMAL HIGH (ref <150)
VLDL: 36 mg/dL (ref 0–40)

## 2010-11-14 LAB — COMPREHENSIVE METABOLIC PANEL
ALT: 23 U/L (ref 0–35)
AST: 22 U/L (ref 0–37)
Albumin: 3.5 g/dL (ref 3.5–5.2)
Calcium: 9.4 mg/dL (ref 8.4–10.5)
Chloride: 107 mEq/L (ref 96–112)
Creatinine, Ser: 0.75 mg/dL (ref 0.4–1.2)
GFR calc Af Amer: >60 mL/min (ref >60)
Sodium: 142 mEq/L (ref 135–145)

## 2010-11-14 LAB — PROTIME-INR
INR: 0.9 (ref 0.00–1.49)
Prothrombin Time: 13.2 seconds (ref 11.6–15.2)

## 2010-11-14 LAB — TSH: TSH: 0.844 u[IU]/mL (ref 0.350–4.500)

## 2010-11-14 LAB — BRAIN NATRIURETIC PEPTIDE: Pro B Natriuretic peptide (BNP): 163 pg/mL — ABNORMAL HIGH (ref 0.0–100.0)

## 2010-11-14 LAB — CARDIAC PANEL(CRET KIN+CKTOT+MB+TROPI): Relative Index: 2.8 — ABNORMAL HIGH (ref 0.0–2.5)

## 2010-11-28 ENCOUNTER — Other Ambulatory Visit: Payer: Self-pay | Admitting: *Deleted

## 2010-11-28 DIAGNOSIS — L0292 Furuncle, unspecified: Secondary | ICD-10-CM

## 2010-11-28 MED ORDER — DOXYCYCLINE HYCLATE 100 MG PO TABS: 100.0000 mg | ORAL_TABLET | Freq: Two times a day (BID) | ORAL | Status: AC

## 2011-01-02 ENCOUNTER — Other Ambulatory Visit: Payer: Self-pay | Admitting: Endocrinology

## 2011-01-02 DIAGNOSIS — E049 Nontoxic goiter, unspecified: Secondary | ICD-10-CM

## 2011-01-03 MED ORDER — ESOMEPRAZOLE MAGNESIUM 40 MG PO CPDR: 40.0000 mg | DELAYED_RELEASE_CAPSULE | Freq: Every day | ORAL | Status: DC

## 2011-01-06 ENCOUNTER — Encounter: Payer: Self-pay | Admitting: Internal Medicine

## 2011-01-11 ENCOUNTER — Ambulatory Visit (INDEPENDENT_AMBULATORY_CARE_PROVIDER_SITE_OTHER): Payer: BC Managed Care – PPO | Admitting: Internal Medicine

## 2011-01-11 ENCOUNTER — Encounter: Payer: Self-pay | Admitting: Internal Medicine

## 2011-01-11 VITALS — BP 130/84 | HR 64 | Temp 98.2°F | Resp 16 | Ht 63.5 in | Wt 292.0 lb

## 2011-01-11 DIAGNOSIS — E049 Nontoxic goiter, unspecified: Secondary | ICD-10-CM

## 2011-01-11 DIAGNOSIS — I4891 Unspecified atrial fibrillation: Secondary | ICD-10-CM

## 2011-01-11 DIAGNOSIS — I10 Essential (primary) hypertension: Secondary | ICD-10-CM

## 2011-01-11 DIAGNOSIS — R609 Edema, unspecified: Secondary | ICD-10-CM

## 2011-01-11 LAB — BASIC METABOLIC PANEL
BUN: 27 mg/dL — ABNORMAL HIGH (ref 6–23)
CO2: 29 mEq/L (ref 19–32)
GFR: 107.39 mL/min (ref >60.00)
Glucose, Bld: 79 mg/dL (ref 70–99)
Potassium: 4.4 mEq/L (ref 3.5–5.1)

## 2011-01-11 LAB — POCT URINALYSIS DIPSTICK
Bilirubin, UA: NEGATIVE
Blood, UA: NEGATIVE
Glucose, UA: NEGATIVE
Nitrite, UA: NEGATIVE
Spec Grav, UA: 1.02
pH, UA: 5.5

## 2011-01-11 MED ORDER — METOPROLOL SUCCINATE ER 50 MG PO TB24: 50.0000 mg | ORAL_TABLET | Freq: Every day | ORAL | Status: DC

## 2011-01-11 NOTE — Assessment & Plan Note (Signed)
-   Weight loss 

## 2011-01-11 NOTE — Progress Notes (Signed)
Subjective:    Patient ID: Samantha Boone, female    DOB: [birth year], 50 y.o.   MRN: [id number]  HPI Patient seen in the emergency room and subsequently by cardiology for atrial fibrillation with rapid ventricular response she was subsequently brought in to use sinus rhythm using Cardizem but developed edema as a side effect from calcium channel blocker. She was changed to twice a day Toprol to maintain her rhythm after conversion and wishes to try the once a day dose and compliance with a missed p.m. dose.  We will need monitoring of her renal function per the nephrology consultant we will also obtain a urine for spot protein and a basic. metabolic panel She also will need monitoring of her thyroid with a TSH free T3 and a free T4   Review of Systems  Constitutional: Negative for activity change, appetite change and fatigue.  HENT: Negative for ear pain, congestion, neck pain, postnasal drip and sinus pressure.   Eyes: Negative for redness and visual disturbance.  Respiratory: Negative for cough, shortness of breath and wheezing.   Gastrointestinal: Negative for abdominal pain and abdominal distention.  Genitourinary: Negative for dysuria, frequency and menstrual problem.  Musculoskeletal: Negative for myalgias, joint swelling and arthralgias.  Skin: Negative for rash and wound.  Neurological: Negative for dizziness, weakness and headaches.  Hematological: Negative for adenopathy. Does not bruise/bleed easily.  Psychiatric/Behavioral: Negative for sleep disturbance and decreased concentration.       Objective:   Physical Exam  Constitutional: She is oriented to person, place, and time. She appears well-developed and well-nourished. No distress.  HENT:  Head: Normocephalic and atraumatic.  Right Ear: External ear normal.  Left Ear: External ear normal.  Nose: Nose normal.  Mouth/Throat: Oropharynx is clear and moist.  Eyes: Conjunctivae and EOM are normal. Pupils are equal,  round, and reactive to light.  Neck: Normal range of motion. Neck supple. No JVD present. No tracheal deviation present. No thyromegaly present.  Cardiovascular: Normal rate, regular rhythm, normal heart sounds and intact distal pulses.   No murmur heard. Pulmonary/Chest: Effort normal and breath sounds normal. She has no wheezes. She exhibits no tenderness.  Abdominal: Soft. Bowel sounds are normal.  Musculoskeletal: Normal range of motion. She exhibits no edema and no tenderness.  Lymphadenopathy:    She has no cervical adenopathy.  Neurological: She is alert and oriented to person, place, and time. She has normal reflexes. No cranial nerve deficit.  Skin: Skin is warm and dry. She is not diaphoretic.  Psychiatric: She has a normal mood and affect. Her behavior is normal.        Past Medical History  Diagnosis Date  . Allergy   . Anemia   . GERD (gastroesophageal reflux disease)   . Headache   . Hyperlipidemia   . Hypertension   . Arthritis   . Hx: UTI (urinary tract infection)   . Atrial fibrillation    Past Surgical History  Procedure Date  . Fibroid removed   . Uterine ablation     reports that she has quit smoking. She does not have any smokeless tobacco history on file. She reports that she drinks alcohol. She reports that she does not use illicit drugs. family history includes Arthritis in her mother; Heart disease in her father; Hyperlipidemia in her father and mother; Hypertension in her father and mother; and Prostate cancer in an unspecified family member. No Known Allergies   Assessment & Plan:  3 issues are being monitored today #  1 is her history of atrial fibrillation now converted to sinus rhythm will convert her medications to long-acting Toprol succinate and she has been given by her cardiologist but denied if she develops an acute rapid response atrial fibrillation.  We will monitor her potassium today to make sure that her electrolytes are normal.  The  second issue is her chronic renal insufficiency followed by Dr. Eliott Nine at the that will be obtained today as well as a spot urine protein is being followed for rule out nephrosis. The third issue today is goiter with a history of hypothyroid her TSH T33 NT for free will be obtained copies of these labs should be sent for appropriate referring physicians Dr. Eliott Nine and Dr. Lisabeth Devoid.

## 2011-01-12 LAB — TSH: TSH: 1.15 u[IU]/mL (ref 0.35–5.50)

## 2011-01-12 LAB — T3, FREE: T3, Free: 2.6 pg/mL (ref 2.3–4.2)

## 2011-01-12 LAB — T4, FREE: Free T4: 0.81 ng/dL (ref 0.60–1.60)

## 2011-01-25 ENCOUNTER — Telehealth: Payer: Self-pay | Admitting: *Deleted

## 2011-01-25 NOTE — Telephone Encounter (Signed)
We reviewed the patient's urinary tract symptoms and she is asymptomatic therefore the leukocytosis seen on the urine dip was probably contamination.  The urine dip was performed to look for proteinuria

## 2011-03-14 ENCOUNTER — Ambulatory Visit
Admission: RE | Admit: 2011-03-14 | Discharge: 2011-03-14 | Disposition: A | Discharge status: Home / Self Care | Payer: BC Managed Care – PPO | Source: Ambulatory Visit | Attending: Endocrinology | Admitting: Endocrinology

## 2011-03-14 DIAGNOSIS — E049 Nontoxic goiter, unspecified: Secondary | ICD-10-CM

## 2011-03-29 ENCOUNTER — Other Ambulatory Visit: Payer: Self-pay | Admitting: Endocrinology

## 2011-03-29 DIAGNOSIS — E049 Nontoxic goiter, unspecified: Secondary | ICD-10-CM

## 2011-03-30 ENCOUNTER — Telehealth: Payer: Self-pay | Admitting: *Deleted

## 2011-03-30 NOTE — Telephone Encounter (Signed)
Opened in error

## 2011-05-03 ENCOUNTER — Other Ambulatory Visit: Payer: Self-pay | Admitting: *Deleted

## 2011-05-03 MED ORDER — LOSARTAN POTASSIUM 100 MG PO TABS: 100.0000 mg | ORAL_TABLET | Freq: Two times a day (BID) | ORAL | Status: DC

## 2011-05-03 MED ORDER — NAPROXEN SODIUM 550 MG PO TABS: 550.0000 mg | ORAL_TABLET | Freq: Two times a day (BID) | ORAL | Status: DC

## 2011-06-05 ENCOUNTER — Other Ambulatory Visit: Payer: Self-pay | Admitting: *Deleted

## 2011-06-05 MED ORDER — FLUTICASONE PROPIONATE 50 MCG/ACT NA SUSP: 2.0000 | Freq: Every day | NASAL | Status: DC

## 2011-07-12 ENCOUNTER — Encounter: Payer: Self-pay | Admitting: Internal Medicine

## 2011-07-12 ENCOUNTER — Ambulatory Visit
Admission: RE | Admit: 2011-07-12 | Discharge: 2011-07-12 | Disposition: A | Discharge status: Home / Self Care | Payer: BC Managed Care – PPO | Source: Ambulatory Visit | Attending: Endocrinology | Admitting: Endocrinology

## 2011-07-12 ENCOUNTER — Ambulatory Visit (INDEPENDENT_AMBULATORY_CARE_PROVIDER_SITE_OTHER): Payer: BC Managed Care – PPO | Admitting: Internal Medicine

## 2011-07-12 VITALS — BP 144/84 | HR 80 | Temp 98.6°F | Resp 16 | Ht 63.5 in | Wt 295.0 lb

## 2011-07-12 DIAGNOSIS — E039 Hypothyroidism, unspecified: Secondary | ICD-10-CM

## 2011-07-12 DIAGNOSIS — E049 Nontoxic goiter, unspecified: Secondary | ICD-10-CM

## 2011-07-12 DIAGNOSIS — Z23 Encounter for immunization: Secondary | ICD-10-CM

## 2011-07-12 LAB — TSH: TSH: 0.86 u[IU]/mL (ref 0.35–5.50)

## 2011-07-12 LAB — T3, FREE: T3, Free: 3.1 pg/mL (ref 2.3–4.2)

## 2011-07-12 MED ORDER — CHLORPHENIRAMINE-HYDROCODONE 8-10 MG/5ML PO LQCR
5.0000 mL | Freq: Two times a day (BID) | ORAL | Status: AC | PRN
Start: 2011-07-12 — End: 2012-07-11

## 2011-07-12 NOTE — Progress Notes (Signed)
  Subjective:    Patient ID: Samantha Boone, female    DOB: Jul 19, 1961, 50 y.o.   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  HENT: Negative for ear pain, congestion, neck pain, postnasal drip and sinus pressure.   Eyes: Negative for redness and visual disturbance.  Respiratory: Negative for cough, shortness of breath and wheezing.   Gastrointestinal: Negative for abdominal pain and abdominal distention.  Genitourinary: Negative for dysuria, frequency and menstrual problem.  Musculoskeletal: Negative for myalgias, joint swelling and arthralgias.  Skin: Negative for rash and wound.  Neurological: Negative for dizziness, weakness and headaches.  Hematological: Negative for adenopathy. Does not bruise/bleed easily.  Psychiatric/Behavioral: Negative for sleep disturbance and decreased concentration.       Objective:   Physical Exam  Nursing note and vitals reviewed. Constitutional: She is oriented to person, place, and time. She appears well-developed and well-nourished. No distress.  HENT:  Head: Normocephalic and atraumatic.  Right Ear: External ear normal.  Left Ear: External ear normal.  Nose: Nose normal.  Mouth/Throat: Oropharynx is clear and moist.  Eyes: Conjunctivae and EOM are normal. Pupils are equal, round, and reactive to light.  Neck: Normal range of motion. Neck supple. No JVD present. No tracheal deviation present. No thyromegaly present.  Cardiovascular: Normal rate, regular rhythm, normal heart sounds and intact distal pulses.   No murmur heard. Pulmonary/Chest: Effort normal and breath sounds normal. She has no wheezes. She exhibits no tenderness.  Abdominal: Soft. Bowel sounds are normal.  Musculoskeletal: Normal range of motion. She exhibits no edema and no tenderness.    Lymphadenopathy:    She has no cervical adenopathy.  Neurological: She is alert and oriented to person, place, and time. She has normal reflexes. No cranial nerve deficit.  Skin: Skin is warm and dry. She is not diaphoretic.  Psychiatric: She has a normal mood and affect. Her behavior is normal.          Assessment & Plan:  Monitored with appropriate thyroid labs continue to discuss obesity

## 2011-07-12 NOTE — Patient Instructions (Signed)
Patient was instructed to continue all medications as prescribed. To stop at the checkout desk and schedule a followup appointment  

## 2011-07-17 ENCOUNTER — Other Ambulatory Visit: Payer: BC Managed Care – PPO

## 2011-09-06 ENCOUNTER — Other Ambulatory Visit: Payer: Self-pay | Admitting: *Deleted

## 2011-09-06 MED ORDER — MELOXICAM 15 MG PO TABS: 15.0000 mg | ORAL_TABLET | Freq: Every day | ORAL | Status: DC

## 2011-09-27 ENCOUNTER — Other Ambulatory Visit: Payer: Self-pay | Admitting: Obstetrics and Gynecology

## 2011-09-27 DIAGNOSIS — Z1231 Encounter for screening mammogram for malignant neoplasm of breast: Secondary | ICD-10-CM

## 2011-09-28 ENCOUNTER — Encounter: Payer: Self-pay | Admitting: Internal Medicine

## 2011-09-28 ENCOUNTER — Ambulatory Visit (INDEPENDENT_AMBULATORY_CARE_PROVIDER_SITE_OTHER): Payer: BC Managed Care – PPO | Admitting: Internal Medicine

## 2011-09-28 VITALS — BP 140/80 | HR 63 | Ht 64.0 in | Wt 297.0 lb

## 2011-09-28 DIAGNOSIS — I4891 Unspecified atrial fibrillation: Secondary | ICD-10-CM

## 2011-09-28 DIAGNOSIS — E785 Hyperlipidemia, unspecified: Secondary | ICD-10-CM

## 2011-09-28 DIAGNOSIS — I10 Essential (primary) hypertension: Secondary | ICD-10-CM

## 2011-09-28 DIAGNOSIS — R635 Abnormal weight gain: Secondary | ICD-10-CM

## 2011-09-28 NOTE — Assessment & Plan Note (Signed)
No recurrence.  Keep on b blocker.  Has flecanide to take PRN.  Continue ASA If has recurrences would recom sleep study.

## 2011-09-28 NOTE — Progress Notes (Signed)
HPI  Patient is a 51 year old with a history of HTN, dyslipidemia and PAF (first and only episode in January 2012)   Since seen she has been doing well.  Breathing is OK No CP. She notes rare palpitations that only last a few seconds.  None prolonged.   Admits to not exercising as often as should  Following diet, frustrated at lack of wt loss. Does admit to snoring.  Not sure she has apnea though husband Gaynell Face says she does wake up abruptly at night and does fall asleep easily during the day. No Known Allergies  Current Outpatient Prescriptions  Medication Sig Dispense Refill  . aspirin 325 MG tablet Take 325 mg by mouth daily.        . Calcium Carbonate (CALTRATE 600 PO) Take 1 tablet by mouth daily.       . chlorpheniramine (CHLOR-TRIMETON) 4 MG tablet Take 4 mg by mouth daily as needed.        . chlorpheniramine-hydrocodone (TUSSIONEX) 8-10 MG/5ML suspension Take 5 mLs by mouth every 12 (twelve) hours as needed for cough.  240 mL  0  . Cholecalciferol (VITAMIN D3) 50000 UNITS CAPS Take by mouth once a week.        . esomeprazole (NEXIUM) 40 MG capsule Take 1 capsule (40 mg total) by mouth daily.  90 capsule  3  . ezetimibe (ZETIA) 10 MG tablet Take 10 mg by mouth. 1/2 daily        . flecainide (TAMBOCOR) 50 MG tablet Take 50 mg by mouth. Take 3 tabs for AFIB, may repeat 3 tablets by mouth in 2 hours for AFIB       . fluticasone (FLONASE) 50 MCG/ACT nasal spray Place 2 sprays into the nose daily.  48 g  3  . glucosamine-chondroitin 500-400 MG tablet Take 1 tablet by mouth 2 (two) times daily.       Marland Kitchen KRILL OIL 1000 MG CAPS Take by mouth daily.        Marland Kitchen losartan (COZAAR) 100 MG tablet Take 100 mg by mouth daily.      . meloxicam (MOBIC) 15 MG tablet Take 1 tablet (15 mg total) by mouth daily.  90 tablet  3  . metoprolol (TOPROL XL) 50 MG 24 hr tablet Take 1 tablet (50 mg total) by mouth daily.  90 tablet  3  . multivitamin (THERAGRAN) per tablet Take 1 tablet by mouth daily.        .  naproxen sodium (ANAPROX DS) 550 MG tablet Take 1 tablet (550 mg total) by mouth 2 (two) times daily with a meal.  180 tablet  3  . Red Yeast Rice 600 MG CAPS Take by mouth 2 (two) times daily.        Marland Kitchen URELLE (URELLE/URISED) 81 MG TABS Take 1 tablet by mouth every 6 (six) hours as needed.          Past Medical History  Diagnosis Date  . Allergy   . Anemia   . GERD (gastroesophageal reflux disease)   . Headache   . Hyperlipidemia   . Hypertension   . Arthritis   . Hx: UTI (urinary tract infection)   . Atrial fibrillation     Past Surgical History  Procedure Date  . Fibroid removed   . Uterine ablation     Family History  Problem Relation Age of Onset  . Prostate cancer    . Hyperlipidemia Mother   . Hypertension Mother   .  Arthritis Mother   . Hypertension Father   . Hyperlipidemia Father   . Heart disease Father     History   Social History  . Marital Status: Married    Spouse Name: N/A    Number of Children: N/A  . Years of Education: N/A   Occupational History  . Not on file.   Social History Main Topics  . Smoking status: Former Games developer  . Smokeless tobacco: Not on file  . Alcohol Use: Yes  . Drug Use: No  . Sexually Active: Yes   Other Topics Concern  . Not on file   Social History Narrative  . No narrative on file    Review of Systems:  All systems reviewed.  They are negative to the above problem except as previously stated.  Vital Signs: BP 140/80  Pulse 63  Ht 5\' 4"  (1.626 m)  Wt 297 lb (134.718 kg)  BMI 50.98 kg/m2  Physical Exam  Patient is in NAD HEENT:  Normocephalic, atraumatic. EOMI, PERRLA.  Neck: JVP is normal.  No bruits.  Lungs: clear to auscultation. No rales no wheezes.  Heart: Regular rate and rhythm. Normal S1, S2. No S3.   No significant murmurs. PMI not displaced.  Abdomen:  Supple, nontender. Normal bowel sounds. No masses. No hepatomegaly.  Extremities:   Good distal pulses throughout. No lower extremity edema.    Musculoskeletal :moving all extremities.  Neuro:   alert and oriented x3.  CN II-XII grossly intact.  EKG:  Sinus rhythm.  63 bpm  Assessment and Plan:

## 2011-09-28 NOTE — Assessment & Plan Note (Signed)
Counselled on diet.  Husband asked aboutAmberen (ammonium succinate) Rec stationary bike.

## 2011-09-28 NOTE — Assessment & Plan Note (Signed)
Adequate control  She says it is usually lower than this.  Continue same meds.

## 2011-09-28 NOTE — Assessment & Plan Note (Signed)
Has missed some red yeast rice.  Will check lipids later this spring.

## 2011-10-03 ENCOUNTER — Other Ambulatory Visit: Payer: Self-pay | Admitting: *Deleted

## 2011-10-03 MED ORDER — CIPROFLOXACIN HCL 250 MG PO TABS: 250.0000 mg | ORAL_TABLET | Freq: Two times a day (BID) | ORAL | Status: AC

## 2011-10-03 MED ORDER — URELLE 81 MG PO TABS: 1.0000 | ORAL_TABLET | Freq: Four times a day (QID) | ORAL | Status: DC | PRN

## 2011-10-18 ENCOUNTER — Ambulatory Visit: Payer: BC Managed Care – PPO | Admitting: Internal Medicine

## 2011-10-18 ENCOUNTER — Ambulatory Visit (INDEPENDENT_AMBULATORY_CARE_PROVIDER_SITE_OTHER): Payer: BC Managed Care – PPO | Admitting: Internal Medicine

## 2011-10-18 ENCOUNTER — Encounter: Payer: Self-pay | Admitting: Internal Medicine

## 2011-10-18 VITALS — BP 140/80 | HR 76 | Temp 98.2°F | Resp 16 | Ht 64.0 in | Wt 297.0 lb

## 2011-10-18 DIAGNOSIS — E049 Nontoxic goiter, unspecified: Secondary | ICD-10-CM

## 2011-10-18 DIAGNOSIS — E785 Hyperlipidemia, unspecified: Secondary | ICD-10-CM

## 2011-10-18 DIAGNOSIS — R300 Dysuria: Secondary | ICD-10-CM

## 2011-10-18 DIAGNOSIS — I10 Essential (primary) hypertension: Secondary | ICD-10-CM

## 2011-10-18 DIAGNOSIS — L0293 Carbuncle, unspecified: Secondary | ICD-10-CM | POA: Insufficient documentation

## 2011-10-18 DIAGNOSIS — L0292 Furuncle, unspecified: Secondary | ICD-10-CM

## 2011-10-18 DIAGNOSIS — D649 Anemia, unspecified: Secondary | ICD-10-CM

## 2011-10-18 LAB — BASIC METABOLIC PANEL
BUN: 23 mg/dL (ref 6–23)
Calcium: 9.3 mg/dL (ref 8.4–10.5)
GFR: 101.39 mL/min (ref >60.00)
Glucose, Bld: 93 mg/dL (ref 70–99)

## 2011-10-18 LAB — CBC WITH DIFFERENTIAL/PLATELET
Basophils Absolute: 0 10*3/uL (ref 0.0–0.1)
Eosinophils Relative: 3.6 % (ref 0.0–5.0)
HCT: 37 % (ref 36.0–46.0)
Lymphs Abs: 2.2 10*3/uL (ref 0.7–4.0)
MCV: 85.3 fl (ref 78.0–100.0)
Monocytes Absolute: 0.5 10*3/uL (ref 0.1–1.0)
Platelets: 319 10*3/uL (ref 150.0–400.0)
RDW: 14.7 % — ABNORMAL HIGH (ref 11.5–14.6)

## 2011-10-18 MED ORDER — DOXYCYCLINE HYCLATE 150 MG PO TBEC: 150.0000 mg | DELAYED_RELEASE_TABLET | Freq: Every day | ORAL | Status: AC

## 2011-10-18 NOTE — Patient Instructions (Signed)
The patient is instructed to continue all medications as prescribed. Schedule followup with check out clerk upon leaving the clinic  

## 2011-10-18 NOTE — Progress Notes (Signed)
Subjective:    Patient ID: Samantha Boone, female    DOB: [birth year], 51 y.o.   MRN: [id number]  HPI  Patient is a 51 year old white female with hypertension hyperlipidemia and a history of atrial fibrillation who is followed by cardiology her last lipid panel was stable cardiology stated that she would not need a lipid peak for one year time.  She has recurrent boils on the buttocks he has had this before they have help of the staff in etiology recently she's been under some extreme stress she had the flu for 2 weeks and was at home at bedrest.    Review of Systems  Constitutional: Negative for activity change, appetite change and fatigue.       At the obese  HENT: Negative for ear pain, congestion, neck pain, postnasal drip and sinus pressure.   Eyes: Negative for redness and visual disturbance.  Respiratory: Negative for cough, shortness of breath and wheezing.   Gastrointestinal: Negative for abdominal pain and abdominal distention.  Genitourinary: Negative for dysuria, frequency and menstrual problem.  Musculoskeletal: Negative for myalgias, joint swelling and arthralgias.  Skin: Negative for rash and wound.  Neurological: Negative for dizziness, weakness and headaches.  Hematological: Negative for adenopathy. Does not bruise/bleed easily.  Psychiatric/Behavioral: Negative for sleep disturbance and decreased concentration.   Past Medical History  Diagnosis Date  . Allergy   . Anemia   . GERD (gastroesophageal reflux disease)   . Headache   . Hyperlipidemia   . Hypertension   . Arthritis   . Hx: UTI (urinary tract infection)   . Atrial fibrillation     History   Social History  . Marital Status: Married    Spouse Name: N/A    Number of Children: N/A  . Years of Education: N/A   Occupational History  . Not on file.   Social History Main Topics  . Smoking status: Former Games developer  . Smokeless tobacco: Not on file  . Alcohol Use: Yes  . Drug Use: No  . Sexually  Active: Yes   Other Topics Concern  . Not on file   Social History Narrative  . No narrative on file    Past Surgical History  Procedure Date  . Fibroid removed   . Uterine ablation     Family History  Problem Relation Age of Onset  . Prostate cancer    . Hyperlipidemia Mother   . Hypertension Mother   . Arthritis Mother   . Hypertension Father   . Hyperlipidemia Father   . Heart disease Father     No Known Allergies  Current Outpatient Prescriptions on File Prior to Visit  Medication Sig Dispense Refill  . aspirin 325 MG tablet Take 325 mg by mouth daily.        . Calcium Carbonate (CALTRATE 600 PO) Take 1 tablet by mouth daily.       . chlorpheniramine (CHLOR-TRIMETON) 4 MG tablet Take 4 mg by mouth daily as needed.        . chlorpheniramine-hydrocodone (TUSSIONEX) 8-10 MG/5ML suspension Take 5 mLs by mouth every 12 (twelve) hours as needed for cough.  240 mL  0  . Cholecalciferol (VITAMIN D3) 50000 UNITS CAPS Take by mouth once a week.        . esomeprazole (NEXIUM) 40 MG capsule Take 1 capsule (40 mg total) by mouth daily.  90 capsule  3  . ezetimibe (ZETIA) 10 MG tablet Take 10 mg by mouth. 1/2 daily        .  flecainide (TAMBOCOR) 50 MG tablet Take 50 mg by mouth. Take 3 tabs for AFIB, may repeat 3 tablets by mouth in 2 hours for AFIB       . glucosamine-chondroitin 500-400 MG tablet Take 1 tablet by mouth 2 (two) times daily.       Marland Kitchen KRILL OIL 1000 MG CAPS Take by mouth daily.        Marland Kitchen losartan (COZAAR) 100 MG tablet Take 100 mg by mouth daily.      . meloxicam (MOBIC) 15 MG tablet Take 1 tablet (15 mg total) by mouth daily.  90 tablet  3  . metoprolol (TOPROL XL) 50 MG 24 hr tablet Take 1 tablet (50 mg total) by mouth daily.  90 tablet  3  . multivitamin (THERAGRAN) per tablet Take 1 tablet by mouth daily.        . Red Yeast Rice 600 MG CAPS Take by mouth 2 (two) times daily.        Marland Kitchen URELLE (URELLE/URISED) 81 MG TABS Take 1 tablet (81 mg total) by mouth every 6  (six) hours as needed.  120 each  3    BP 140/80  Pulse 76  Temp 98.2 F (36.8 C)  Resp 16  Ht 5\' 4"  (1.626 m)  Wt 297 lb (134.718 kg)  BMI 50.98 kg/m2       Objective:   Physical Exam  Nursing note and vitals reviewed. Constitutional: She is oriented to person, place, and time. She appears well-developed and well-nourished. No distress.       Morbid obesity  HENT:  Head: Normocephalic and atraumatic.  Right Ear: External ear normal.  Left Ear: External ear normal.  Nose: Nose normal.  Mouth/Throat: Oropharynx is clear and moist.  Eyes: Conjunctivae and EOM are normal. Pupils are equal, round, and reactive to light.  Neck: Normal range of motion. Neck supple. No JVD present. No tracheal deviation present. No thyromegaly present.  Cardiovascular: Normal rate, regular rhythm, normal heart sounds and intact distal pulses.   No murmur heard. Pulmonary/Chest: Effort normal and breath sounds normal. She has no wheezes. She exhibits no tenderness.  Abdominal: Soft. Bowel sounds are normal.  Musculoskeletal: Normal range of motion. She exhibits no edema and no tenderness.  Lymphadenopathy:    She has no cervical adenopathy.  Neurological: She is alert and oriented to person, place, and time. She has normal reflexes. No cranial nerve deficit.  Skin: Skin is warm and dry. She is not diaphoretic.  Psychiatric: She has a normal mood and affect. Her behavior is normal.          Assessment & Plan:  Again the patient's main diagnosis is her morbid obesity we discussed weight loss exercise and dietary restrictions.  She is also followed for goiter and her normal thyroid levels were discussed with her.  She has a history of anemia and a CBC differential will be drawn today she has recurrent staph boils of the skin of the Botox as she is placed on doxycycline 150 mg time released the 2 gastric upset from doxycycline and she will take that daily for the next 21 days.  Her blood  pressure is currently stable

## 2011-10-25 ENCOUNTER — Ambulatory Visit: Payer: BC Managed Care – PPO

## 2011-11-02 ENCOUNTER — Ambulatory Visit
Admission: RE | Admit: 2011-11-02 | Discharge: 2011-11-02 | Disposition: A | Discharge status: Home / Self Care | Payer: BC Managed Care – PPO | Source: Ambulatory Visit | Attending: Obstetrics and Gynecology | Admitting: Obstetrics and Gynecology

## 2011-11-02 DIAGNOSIS — Z1231 Encounter for screening mammogram for malignant neoplasm of breast: Secondary | ICD-10-CM

## 2011-12-26 ENCOUNTER — Other Ambulatory Visit: Payer: Self-pay | Admitting: *Deleted

## 2011-12-26 MED ORDER — VITAMIN D3 1.25 MG (50000 UT) PO CAPS: 1.0000 | ORAL_CAPSULE | ORAL | Status: DC

## 2012-01-05 ENCOUNTER — Other Ambulatory Visit: Payer: Self-pay

## 2012-01-05 DIAGNOSIS — I10 Essential (primary) hypertension: Secondary | ICD-10-CM

## 2012-01-05 DIAGNOSIS — I4891 Unspecified atrial fibrillation: Secondary | ICD-10-CM

## 2012-01-05 MED ORDER — METOPROLOL SUCCINATE ER 50 MG PO TB24: 50.0000 mg | ORAL_TABLET | Freq: Every day | ORAL | Status: DC

## 2012-02-11 IMAGING — US US SOFT TISSUE HEAD/NECK
1 series · 14 of 25 positions shown · non-contrast
Comparison: Ultrasound 02/23/2009, ultrasound 02/17/2009

CLINICAL DATA: Following goiter

THYROID ULTRASOUND
TECHNIQUE: Ultrasound examination of the thyroid gland and
adjacent soft tissues was performed.

[Series 1: us soft tissue head/neck · 0.09mm/px · 14 of 55 slices shown]
[im 1/55]
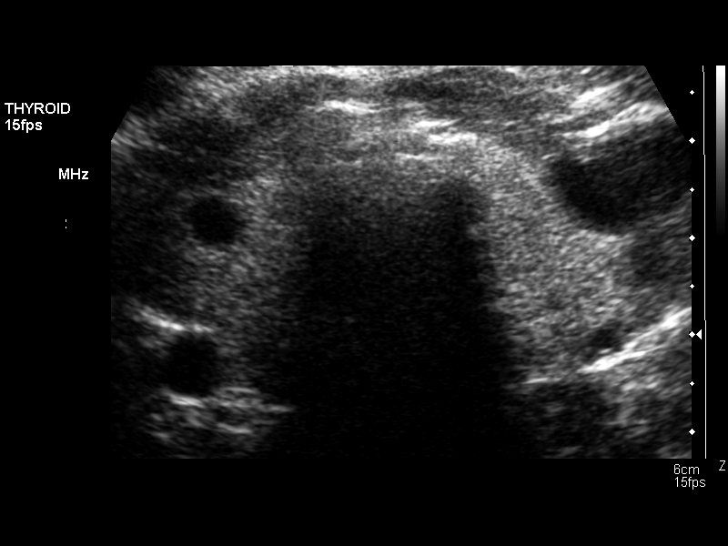
[im 5/55]
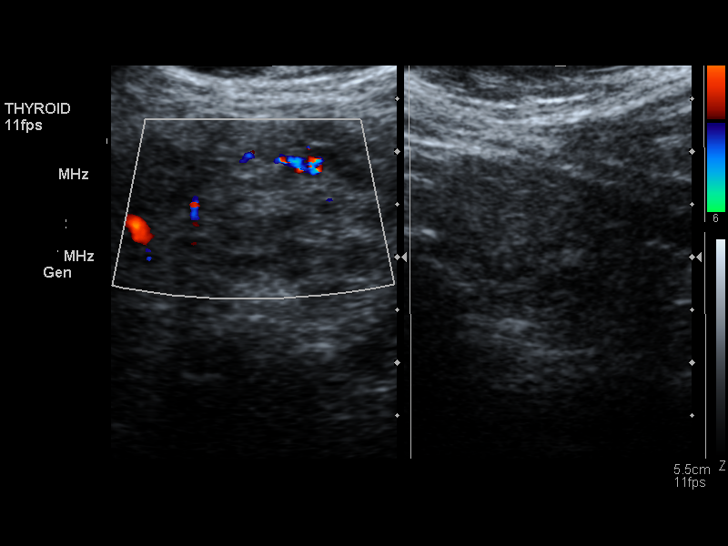
[im 10/55]
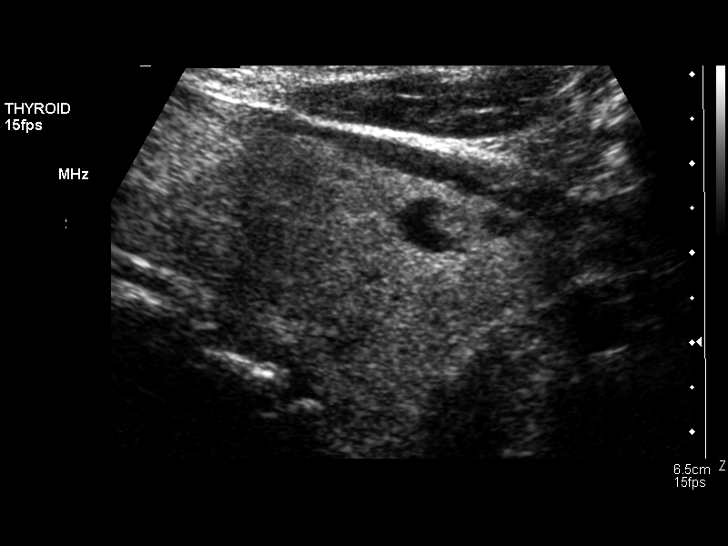
[im 14/55]
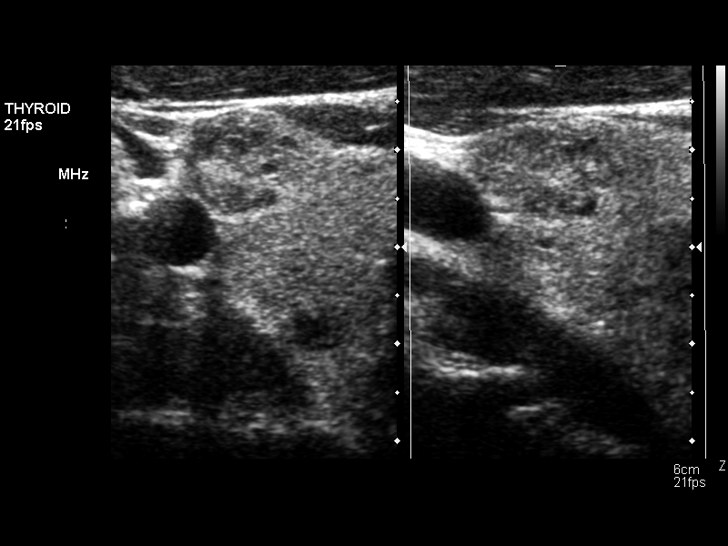
[im 19/55]
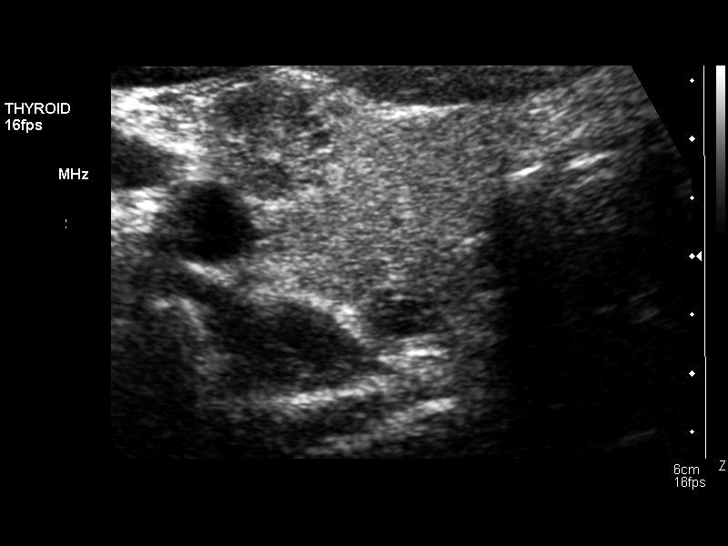
[im 21/55]
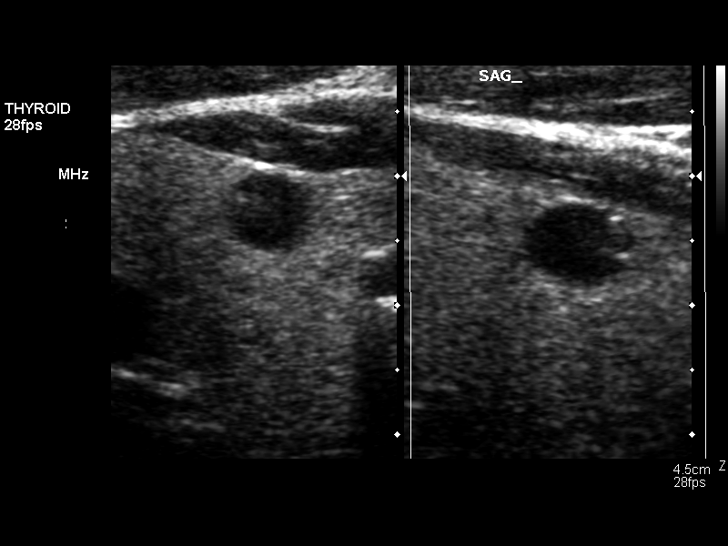
[im 25/55]
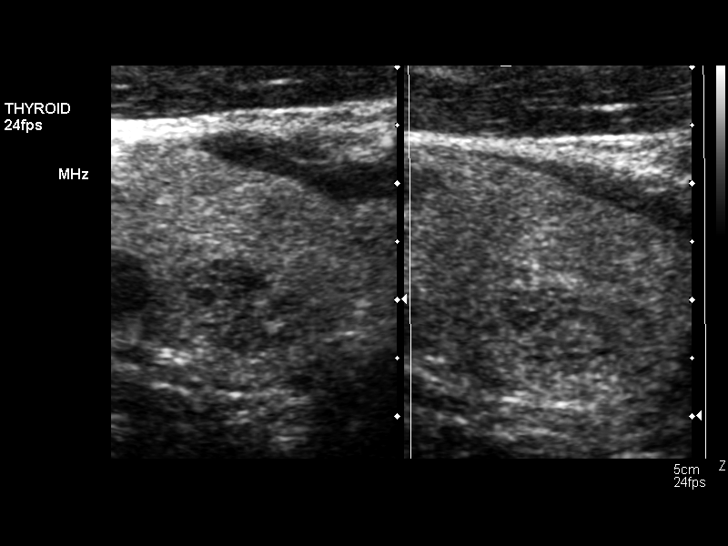
[im 30/55]
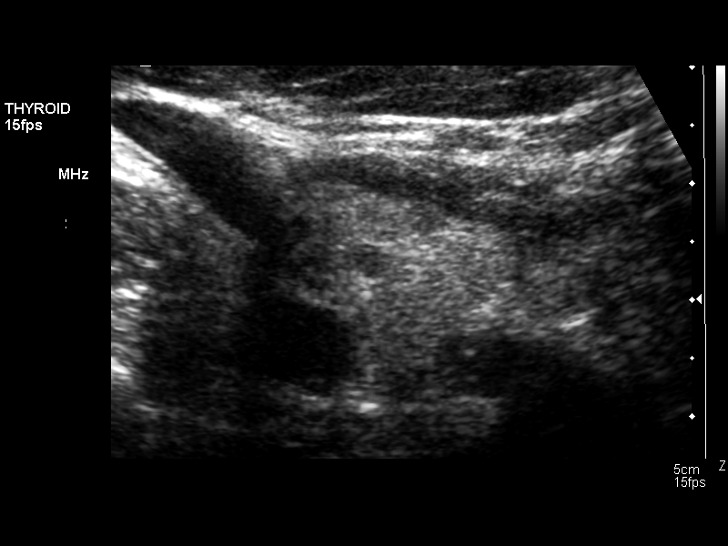
[im 34/55]
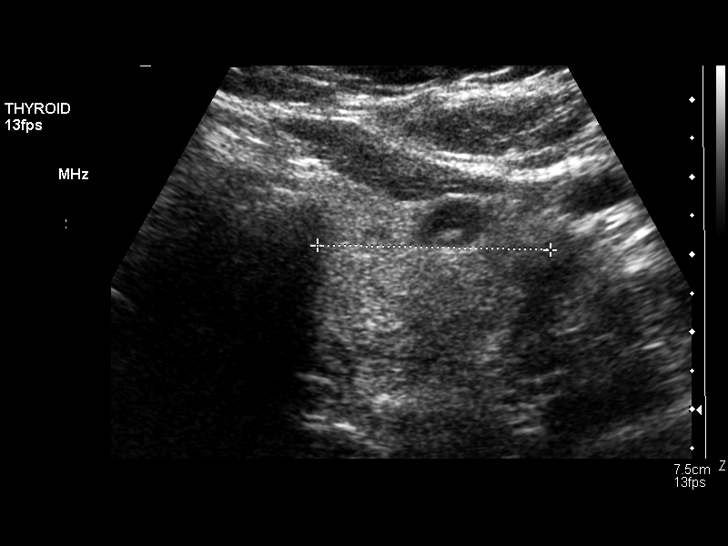
[im 37/55]
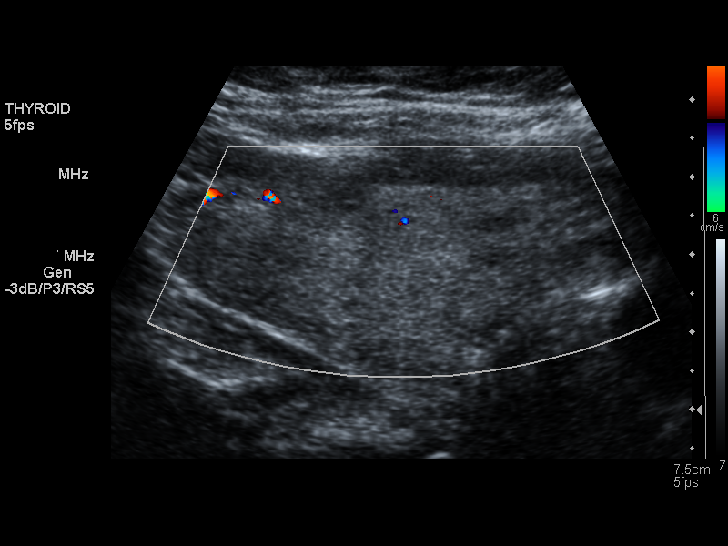
[im 41/55]
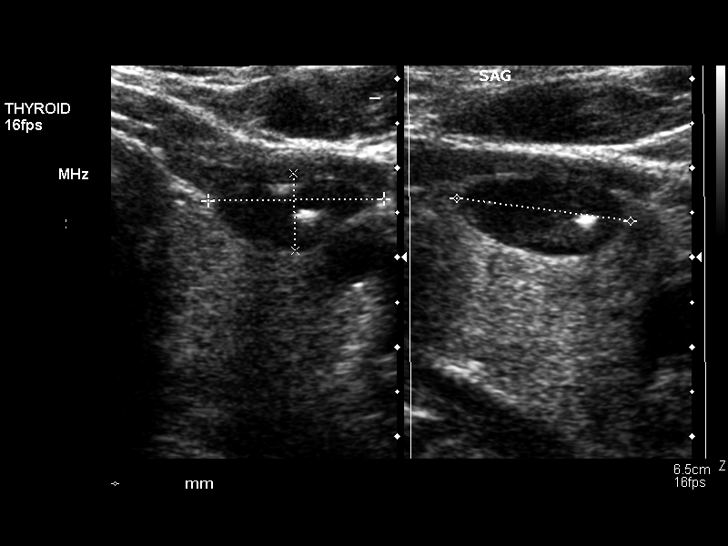
[im 46/55]
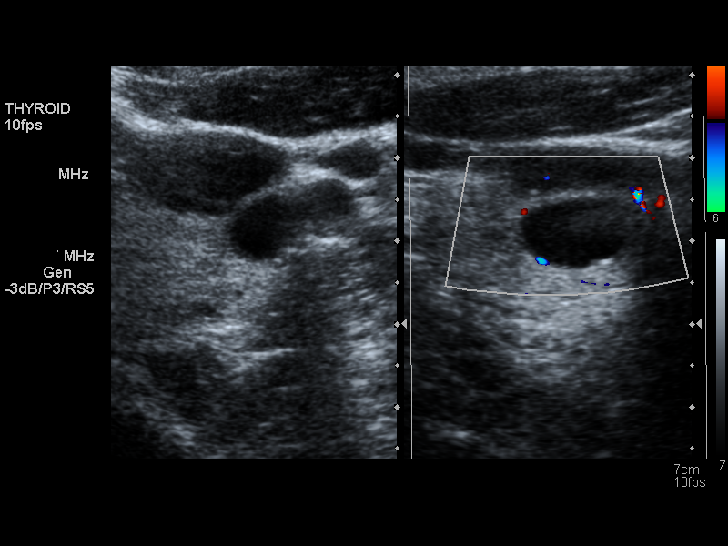
[im 50/55]
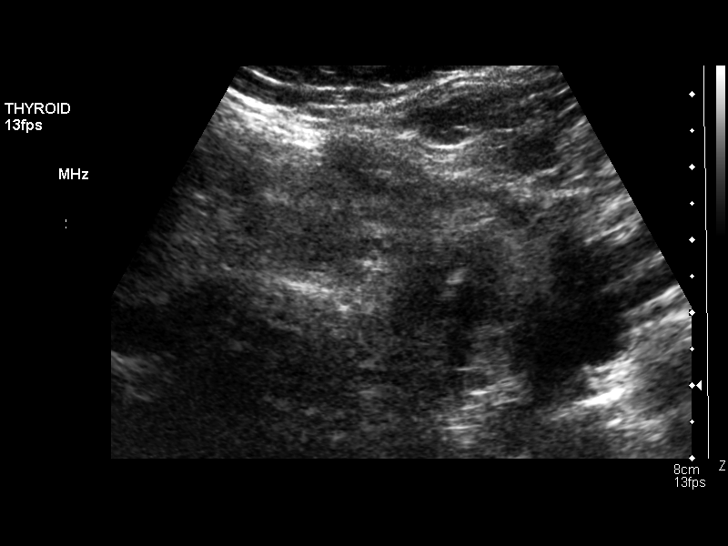
[im 55/55]
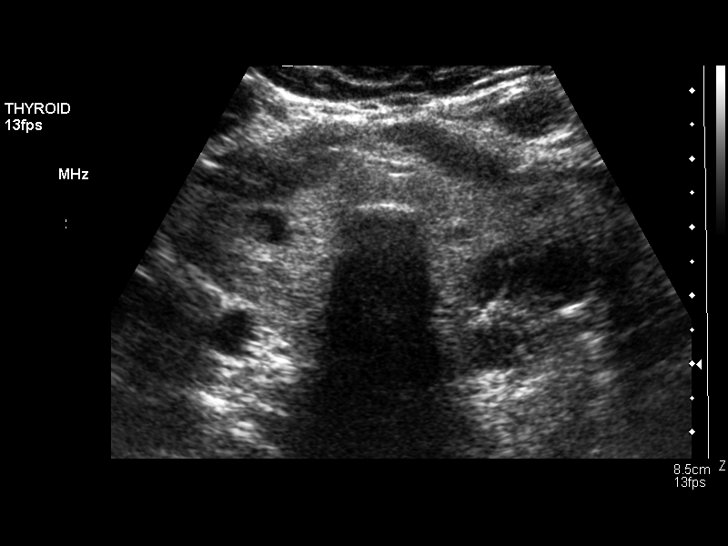

[14 of 25 positions shown; findings below may reference images not displayed]

FINDINGS: Overall size of the gland is mildly enlarged but not
increased in size from prior with the right lobe of thyroid gland
measuring 4.8 x 3.1 x 2.9 cm in the left lobe measuring 6.2 x 2.65-
0.0 cm.

There are multiple solid and cystic nodules within the thyroid
gland. Solid nodules are isoechoic.  No evidence of calcifications
within the nodules. The majority these nodules are  predominantly
cystic .  Largest nodule in the inferior left lobe of the thyroid
gland measures 2.2 cm compared to 2.9 cm on prior.
IMPRESSION: Multinodular goiter.   None of the nodules have specific malignant
characteristics.

## 2012-04-09 ENCOUNTER — Other Ambulatory Visit: Payer: Self-pay | Admitting: *Deleted

## 2012-04-09 MED ORDER — ESOMEPRAZOLE MAGNESIUM 40 MG PO CPDR: 40.0000 mg | DELAYED_RELEASE_CAPSULE | Freq: Every day | ORAL | Status: DC

## 2012-06-05 ENCOUNTER — Ambulatory Visit: Payer: BC Managed Care – PPO | Admitting: Internal Medicine

## 2012-06-06 ENCOUNTER — Other Ambulatory Visit: Payer: Self-pay | Admitting: *Deleted

## 2012-06-06 ENCOUNTER — Telehealth: Payer: Self-pay | Admitting: *Deleted

## 2012-06-06 ENCOUNTER — Other Ambulatory Visit (INDEPENDENT_AMBULATORY_CARE_PROVIDER_SITE_OTHER): Payer: BC Managed Care – PPO

## 2012-06-06 DIAGNOSIS — I4891 Unspecified atrial fibrillation: Secondary | ICD-10-CM

## 2012-06-06 NOTE — Telephone Encounter (Signed)
Pt here for EKG. In atrial flutter at present, rate 115. Spoke with dr Tenny Craw. Pt to take an extra metoprolol tart now. She will call dr Tenny Craw tomorrow morning. bp 140/90.

## 2012-06-07 ENCOUNTER — Other Ambulatory Visit: Payer: Self-pay | Admitting: *Deleted

## 2012-06-07 LAB — BASIC METABOLIC PANEL
CO2: 27 mEq/L (ref 19–32)
Calcium: 9.5 mg/dL (ref 8.4–10.5)
Glucose, Bld: 114 mg/dL — ABNORMAL HIGH (ref 70–99)
Sodium: 139 mEq/L (ref 135–145)

## 2012-06-07 MED ORDER — FLECAINIDE ACETATE 150 MG PO TABS: 150.0000 mg | ORAL_TABLET | ORAL | Status: DC | PRN

## 2012-06-07 MED ORDER — METOPROLOL TARTRATE 25 MG PO TABS: 25.0000 mg | ORAL_TABLET | Freq: Two times a day (BID) | ORAL | Status: DC

## 2012-06-24 ENCOUNTER — Other Ambulatory Visit: Payer: Self-pay | Admitting: *Deleted

## 2012-06-24 DIAGNOSIS — I4891 Unspecified atrial fibrillation: Secondary | ICD-10-CM

## 2012-06-24 DIAGNOSIS — I10 Essential (primary) hypertension: Secondary | ICD-10-CM

## 2012-06-24 MED ORDER — METOPROLOL SUCCINATE ER 50 MG PO TB24: ORAL_TABLET | ORAL | Status: DC

## 2012-06-28 NOTE — Addendum Note (Signed)
Addended by: Freddi Starr on: 06/28/2012 05:31 PM   Modules accepted: Orders

## 2012-08-08 ENCOUNTER — Ambulatory Visit (INDEPENDENT_AMBULATORY_CARE_PROVIDER_SITE_OTHER): Payer: BC Managed Care – PPO | Admitting: Internal Medicine

## 2012-08-08 ENCOUNTER — Encounter: Payer: Self-pay | Admitting: Internal Medicine

## 2012-08-08 VITALS — BP 140/84 | HR 84 | Temp 98.1°F | Resp 16 | Ht 64.0 in | Wt 308.0 lb

## 2012-08-08 DIAGNOSIS — E042 Nontoxic multinodular goiter: Secondary | ICD-10-CM

## 2012-08-08 DIAGNOSIS — Z23 Encounter for immunization: Secondary | ICD-10-CM

## 2012-08-08 DIAGNOSIS — L089 Local infection of the skin and subcutaneous tissue, unspecified: Secondary | ICD-10-CM

## 2012-08-08 DIAGNOSIS — B369 Superficial mycosis, unspecified: Secondary | ICD-10-CM

## 2012-08-08 MED ORDER — FLUCONAZOLE 100 MG PO TABS: 100.0000 mg | ORAL_TABLET | Freq: Once | ORAL | Status: DC

## 2012-08-08 MED ORDER — CLINDAMYCIN HCL 150 MG PO CAPS: 150.0000 mg | ORAL_CAPSULE | Freq: Three times a day (TID) | ORAL | Status: DC

## 2012-08-08 NOTE — Progress Notes (Signed)
Subjective:    Patient ID: Samantha Boone, female    DOB: 1960/11/01, 51 y.o.   MRN: [id number]  HPI  Weight is up Increased stress and anxiety Skin infection under the breast Has festering lesions brused and fungal Leg cramps at HS  In calf and thigh   Review of Systems  Constitutional: Negative for activity change, appetite change and fatigue.  HENT: Negative for ear pain, congestion, neck pain, postnasal drip and sinus pressure.   Eyes: Negative for redness and visual disturbance.  Respiratory: Negative for cough, shortness of breath and wheezing.   Gastrointestinal: Negative for abdominal pain and abdominal distention.  Genitourinary: Negative for dysuria, frequency and menstrual problem.  Musculoskeletal: Negative for myalgias, joint swelling and arthralgias.  Skin: Negative for rash and wound.  Neurological: Negative for dizziness, weakness and headaches.  Hematological: Negative for adenopathy. Does not bruise/bleed easily.  Psychiatric/Behavioral: Negative for sleep disturbance and decreased concentration.   Past Medical History  Diagnosis Date  . Allergy   . Anemia   . GERD (gastroesophageal reflux disease)   . Headache   . Hyperlipidemia   . Hypertension   . Arthritis   . Hx: UTI (urinary tract infection)   . Atrial fibrillation     History   Social History  . Marital Status: Married    Spouse Name: N/A    Number of Children: N/A  . Years of Education: N/A   Occupational History  . Not on file.   Social History Main Topics  . Smoking status: Former Games developer  . Smokeless tobacco: Not on file  . Alcohol Use: Yes  . Drug Use: No  . Sexually Active: Yes   Other Topics Concern  . Not on file   Social History Narrative  . No narrative on file    Past Surgical History  Procedure Date  . Fibroid removed   . Uterine ablation     Family History  Problem Relation Age of Onset  . Prostate cancer    . Hyperlipidemia Mother   . Hypertension  Mother   . Arthritis Mother   . Hypertension Father   . Hyperlipidemia Father   . Heart disease Father     No Known Allergies  Current Outpatient Prescriptions on File Prior to Visit  Medication Sig Dispense Refill  . aspirin 325 MG tablet Take 325 mg by mouth daily.        . Calcium Carbonate (CALTRATE 600 PO) Take 1 tablet by mouth daily.       . chlorpheniramine (CHLOR-TRIMETON) 4 MG tablet Take 4 mg by mouth daily as needed.        . Cholecalciferol (VITAMIN D3) 50000 UNITS CAPS Take 1 capsule by mouth once a week.  24 capsule  1  . esomeprazole (NEXIUM) 40 MG capsule Take 1 capsule (40 mg total) by mouth daily.  90 capsule  3  . ezetimibe (ZETIA) 10 MG tablet Take 10 mg by mouth. 1/2 daily        . flecainide (TAMBOCOR) 50 MG tablet Take 50 mg by mouth. Take 3 tabs for AFIB, may repeat 3 tablets by mouth in 2 hours for AFIB       . fluticasone (FLONASE) 50 MCG/ACT nasal spray Place 2 sprays into the nose daily. 2 sprays in each nostril      . glucosamine-chondroitin 500-400 MG tablet Take 1 tablet by mouth 2 (two) times daily.       Marland Kitchen KRILL OIL 1000 MG CAPS Take  by mouth daily.        Marland Kitchen losartan (COZAAR) 100 MG tablet Take 100 mg by mouth daily.      . meloxicam (MOBIC) 15 MG tablet Take 1 tablet (15 mg total) by mouth daily.  90 tablet  3  . metoprolol tartrate (LOPRESSOR) 25 MG tablet Take 1 tablet (25 mg total) by mouth 2 (two) times daily.  60 tablet  6  . multivitamin (THERAGRAN) per tablet Take 1 tablet by mouth daily.        . Red Yeast Rice 600 MG CAPS Take by mouth 2 (two) times daily.        Marland Kitchen URELLE (URELLE/URISED) 81 MG TABS Take 1 tablet (81 mg total) by mouth every 6 (six) hours as needed.  120 each  3  . [DISCONTINUED] esomeprazole (NEXIUM) 40 MG capsule Take 1 capsule (40 mg total) by mouth daily.  90 capsule  3  . [DISCONTINUED] flecainide (TAMBOCOR) 150 MG tablet Take 1 tablet (150 mg total) by mouth as needed.  12 tablet  6  . [DISCONTINUED] metoprolol succinate  (TOPROL XL) 50 MG 24 hr tablet Take one and one half tablets by mouth daily  140 tablet  3    BP 140/84  Pulse 84  Temp 98.1 F (36.7 C)  Resp 16  Ht 5\' 4"  (1.626 m)  Wt 308 lb (139.708 kg)  BMI 52.87 kg/m2        Objective:   Physical Exam  Constitutional: She is oriented to person, place, and time. She appears well-developed and well-nourished. No distress.  HENT:  Head: Normocephalic and atraumatic.  Right Ear: External ear normal.  Left Ear: External ear normal.  Nose: Nose normal.  Mouth/Throat: Oropharynx is clear and moist.  Eyes: Conjunctivae normal and EOM are normal. Pupils are equal, round, and reactive to light.  Neck: Normal range of motion. Neck supple. No JVD present. No tracheal deviation present. No thyromegaly present.  Cardiovascular: Normal rate, regular rhythm, normal heart sounds and intact distal pulses.   No murmur heard. Pulmonary/Chest: Effort normal and breath sounds normal. She has no wheezes. She exhibits no tenderness.  Abdominal: Soft. Bowel sounds are normal.  Musculoskeletal: Normal range of motion. She exhibits no edema and no tenderness.  Lymphadenopathy:    She has no cervical adenopathy.  Neurological: She is alert and oriented to person, place, and time. She has normal reflexes. No cranial nerve deficit.  Skin: Skin is warm and dry. She is not diaphoretic.  Psychiatric: She has a normal mood and affect. Her behavior is normal.          Assessment & Plan:  High  Risk for DM given weight AF stable followed by cardiology GERD related to obesity Skin infection complicated by DM risk and obesity, topical rx given Stable HTN

## 2012-08-08 NOTE — Patient Instructions (Signed)
Potassium and magnesium OTC for leg cramps  Alternative glass of tonic water

## 2012-09-26 ENCOUNTER — Ambulatory Visit
Admission: RE | Admit: 2012-09-26 | Discharge: 2012-09-26 | Disposition: A | Discharge status: Home / Self Care | Payer: BC Managed Care – PPO | Source: Ambulatory Visit | Attending: Internal Medicine | Admitting: Internal Medicine

## 2012-09-26 DIAGNOSIS — E042 Nontoxic multinodular goiter: Secondary | ICD-10-CM

## 2012-10-02 ENCOUNTER — Encounter: Payer: Self-pay | Admitting: Internal Medicine

## 2012-10-02 DIAGNOSIS — E042 Nontoxic multinodular goiter: Secondary | ICD-10-CM

## 2012-10-10 ENCOUNTER — Encounter: Payer: Self-pay | Admitting: Internal Medicine

## 2012-10-14 ENCOUNTER — Other Ambulatory Visit: Payer: Self-pay | Admitting: *Deleted

## 2012-10-14 MED ORDER — LOSARTAN POTASSIUM 100 MG PO TABS: 100.0000 mg | ORAL_TABLET | Freq: Every day | ORAL | Status: DC

## 2012-10-14 MED ORDER — FLUTICASONE PROPIONATE 50 MCG/ACT NA SUSP: 2.0000 | Freq: Every day | NASAL | Status: DC

## 2012-10-14 MED ORDER — ESOMEPRAZOLE MAGNESIUM 40 MG PO CPDR: 40.0000 mg | DELAYED_RELEASE_CAPSULE | Freq: Every day | ORAL | Status: DC

## 2012-12-11 ENCOUNTER — Ambulatory Visit (INDEPENDENT_AMBULATORY_CARE_PROVIDER_SITE_OTHER): Payer: BC Managed Care – PPO | Admitting: Internal Medicine

## 2012-12-11 ENCOUNTER — Encounter: Payer: Self-pay | Admitting: Internal Medicine

## 2012-12-11 VITALS — BP 140/80 | HR 76 | Temp 98.3°F | Resp 18 | Ht 64.0 in | Wt 310.0 lb

## 2012-12-11 DIAGNOSIS — M25569 Pain in unspecified knee: Secondary | ICD-10-CM

## 2012-12-11 DIAGNOSIS — I10 Essential (primary) hypertension: Secondary | ICD-10-CM

## 2012-12-11 DIAGNOSIS — R635 Abnormal weight gain: Secondary | ICD-10-CM

## 2012-12-11 DIAGNOSIS — L678 Other hair color and hair shaft abnormalities: Secondary | ICD-10-CM

## 2012-12-11 DIAGNOSIS — L0102 Bockhart's impetigo: Secondary | ICD-10-CM

## 2012-12-11 DIAGNOSIS — L738 Other specified follicular disorders: Secondary | ICD-10-CM

## 2012-12-11 DIAGNOSIS — R21 Rash and other nonspecific skin eruption: Secondary | ICD-10-CM

## 2012-12-11 MED ORDER — LEVOFLOXACIN 500 MG PO TABS: 500.0000 mg | ORAL_TABLET | Freq: Every day | ORAL | Status: DC

## 2012-12-11 MED ORDER — MUPIROCIN CALCIUM 2 % EX CREA: TOPICAL_CREAM | Freq: Two times a day (BID) | CUTANEOUS | Status: DC

## 2012-12-11 NOTE — Progress Notes (Signed)
  Subjective:    Patient ID: MERCADIES CO, female    DOB: 1960-12-20, 52 y.o.   MRN: [id number]  HPI -year-old female who has a persistent pustular rash/cellulitis underneath her breasts greater on the right than the left with purulent discharge from pustular lesions.  This is persistent for greater than 6 months she's been treated for a fungal infection for topical bacterial cellulitis with intermittent result. Now she has persistent nonpainful rash.     Review of Systems  Constitutional: Negative for activity change, appetite change and fatigue.  HENT: Negative for ear pain, congestion, neck pain, postnasal drip and sinus pressure.   Eyes: Negative for redness and visual disturbance.  Respiratory: Negative for cough, shortness of breath and wheezing.   Gastrointestinal: Negative for abdominal pain and abdominal distention.  Genitourinary: Negative for dysuria, frequency and menstrual problem.  Musculoskeletal: Negative for myalgias, joint swelling and arthralgias.  Skin: Positive for color change and rash. Negative for wound.  Neurological: Negative for dizziness, weakness and headaches.  Hematological: Negative for adenopathy. Does not bruise/bleed easily.  Psychiatric/Behavioral: Negative for sleep disturbance and decreased concentration.       Objective:   Physical Exam  Nursing note and vitals reviewed. Constitutional: She is oriented to person, place, and time. She appears well-developed and well-nourished. No distress.  HENT:  Head: Normocephalic and atraumatic.  Right Ear: External ear normal.  Left Ear: External ear normal.  Nose: Nose normal.  Mouth/Throat: Oropharynx is clear and moist.  Eyes: Conjunctivae and EOM are normal. Pupils are equal, round, and reactive to light.  Neck: Normal range of motion. Neck supple. No JVD present. No tracheal deviation present. No thyromegaly present.  Cardiovascular: Normal rate, regular rhythm, normal heart sounds and intact  distal pulses.   No murmur heard. Pulmonary/Chest: Effort normal and breath sounds normal. She has no wheezes. She exhibits no tenderness.  Abdominal: Soft. Bowel sounds are normal.  Musculoskeletal: Normal range of motion. She exhibits no edema and no tenderness.  Lymphadenopathy:    She has no cervical adenopathy.  Neurological: She is alert and oriented to person, place, and time. She has normal reflexes. No cranial nerve deficit.  Skin: Skin is warm and dry. Rash noted. She is not diaphoretic. There is erythema.  Pustules under breast  Psychiatric: She has a normal mood and affect. Her behavior is normal.          Assessment & Plan:  Referral for nutrition guidance for weigth loss Culture pustula rash and start floxin and topical Bactroban Weight loss

## 2012-12-11 NOTE — Patient Instructions (Signed)
Referral to nutrition

## 2012-12-13 ENCOUNTER — Other Ambulatory Visit: Payer: Self-pay | Admitting: *Deleted

## 2012-12-13 DIAGNOSIS — L0102 Bockhart's impetigo: Secondary | ICD-10-CM

## 2012-12-13 DIAGNOSIS — R21 Rash and other nonspecific skin eruption: Secondary | ICD-10-CM

## 2012-12-13 MED ORDER — LEVOFLOXACIN 500 MG PO TABS: 500.0000 mg | ORAL_TABLET | Freq: Every day | ORAL | Status: DC

## 2012-12-14 LAB — WOUND CULTURE
Gram Stain: NONE SEEN
Gram Stain: NONE SEEN
Gram Stain: NONE SEEN

## 2012-12-26 ENCOUNTER — Other Ambulatory Visit: Payer: Self-pay | Admitting: *Deleted

## 2012-12-26 DIAGNOSIS — L0102 Bockhart's impetigo: Secondary | ICD-10-CM

## 2012-12-26 DIAGNOSIS — R21 Rash and other nonspecific skin eruption: Secondary | ICD-10-CM

## 2012-12-26 MED ORDER — MUPIROCIN CALCIUM 2 % EX CREA: TOPICAL_CREAM | Freq: Two times a day (BID) | CUTANEOUS | Status: AC

## 2013-03-10 ENCOUNTER — Other Ambulatory Visit: Payer: Self-pay

## 2013-03-10 DIAGNOSIS — Z1231 Encounter for screening mammogram for malignant neoplasm of breast: Secondary | ICD-10-CM

## 2013-03-12 ENCOUNTER — Ambulatory Visit: Payer: BC Managed Care – PPO | Admitting: *Deleted

## 2013-03-12 ENCOUNTER — Other Ambulatory Visit: Payer: Self-pay | Admitting: Endocrinology

## 2013-03-12 DIAGNOSIS — E041 Nontoxic single thyroid nodule: Secondary | ICD-10-CM

## 2013-03-21 ENCOUNTER — Other Ambulatory Visit: Payer: Self-pay | Admitting: *Deleted

## 2013-03-21 MED ORDER — FLUTICASONE PROPIONATE 50 MCG/ACT NA SUSP: 2.0000 | Freq: Every day | NASAL | Status: DC

## 2013-03-21 MED ORDER — ESOMEPRAZOLE MAGNESIUM 40 MG PO CPDR: 40.0000 mg | DELAYED_RELEASE_CAPSULE | Freq: Every day | ORAL | Status: DC

## 2013-03-26 ENCOUNTER — Other Ambulatory Visit: Payer: Self-pay | Admitting: Endocrinology

## 2013-03-26 ENCOUNTER — Ambulatory Visit
Admission: RE | Admit: 2013-03-26 | Discharge: 2013-03-26 | Disposition: A | Discharge status: Home / Self Care | Payer: BC Managed Care – PPO | Source: Ambulatory Visit | Attending: Endocrinology | Admitting: Endocrinology

## 2013-03-26 DIAGNOSIS — E041 Nontoxic single thyroid nodule: Secondary | ICD-10-CM

## 2013-04-08 ENCOUNTER — Ambulatory Visit
Admission: RE | Admit: 2013-04-08 | Discharge: 2013-04-08 | Disposition: A | Discharge status: Home / Self Care | Payer: BC Managed Care – PPO | Source: Ambulatory Visit

## 2013-04-08 DIAGNOSIS — Z1231 Encounter for screening mammogram for malignant neoplasm of breast: Secondary | ICD-10-CM

## 2013-04-18 ENCOUNTER — Encounter: Payer: Self-pay | Admitting: Internal Medicine

## 2013-04-18 ENCOUNTER — Ambulatory Visit (INDEPENDENT_AMBULATORY_CARE_PROVIDER_SITE_OTHER): Payer: BC Managed Care – PPO | Admitting: Internal Medicine

## 2013-04-18 VITALS — BP 134/80 | HR 80 | Temp 98.2°F | Resp 16 | Ht 64.0 in | Wt 322.0 lb

## 2013-04-18 DIAGNOSIS — L02219 Cutaneous abscess of trunk, unspecified: Secondary | ICD-10-CM

## 2013-04-18 DIAGNOSIS — R21 Rash and other nonspecific skin eruption: Secondary | ICD-10-CM

## 2013-04-18 DIAGNOSIS — R609 Edema, unspecified: Secondary | ICD-10-CM

## 2013-04-18 DIAGNOSIS — L03319 Cellulitis of trunk, unspecified: Secondary | ICD-10-CM

## 2013-04-18 MED ORDER — BUPROPION HCL ER (XL) 150 MG PO TB24: 150.0000 mg | ORAL_TABLET | Freq: Every day | ORAL | Status: DC

## 2013-04-18 MED ORDER — CETAPHIL GENTLE CLEANSER EX LOTN: TOPICAL_LOTION | CUTANEOUS | Status: DC | PRN

## 2013-04-18 MED ORDER — SPIRONOLACTONE 25 MG PO TABS: 25.0000 mg | ORAL_TABLET | Freq: Every day | ORAL | Status: DC

## 2013-04-18 MED ORDER — CLINDAMYCIN PHOSPHATE 1 % EX LOTN: TOPICAL_LOTION | Freq: Two times a day (BID) | CUTANEOUS | Status: DC

## 2013-04-18 NOTE — Progress Notes (Signed)
  Subjective:    Patient ID: Samantha Boone, female    DOB: Jan 29, 1961, 52 y.o.   MRN: [id number]  HPI Follow up Had thyroid scan and biopsy was planned but since the nodule shrunk she did not have a a biopsy Weight gain and centra pedal weight Discussion of wellbutrin  For appetitie and weight loss Left ankle swelling    Review of Systems  Constitutional: Negative for activity change, appetite change and fatigue.  HENT: Negative for ear pain, congestion, neck pain, postnasal drip and sinus pressure.   Eyes: Negative for redness and visual disturbance.  Respiratory: Negative for cough, shortness of breath and wheezing.   Gastrointestinal: Negative for abdominal pain and abdominal distention.  Genitourinary: Negative for dysuria, frequency and menstrual problem.  Musculoskeletal: Negative for myalgias, joint swelling and arthralgias.  Skin: Negative for rash and wound.  Neurological: Negative for dizziness, weakness and headaches.  Hematological: Negative for adenopathy. Does not bruise/bleed easily.  Psychiatric/Behavioral: Negative for sleep disturbance and decreased concentration.       Objective:   Physical Exam  Nursing note and vitals reviewed. Constitutional: She is oriented to person, place, and time. She appears well-developed and well-nourished. No distress.  HENT:  Head: Normocephalic and atraumatic.  Right Ear: External ear normal.  Left Ear: External ear normal.  Nose: Nose normal.  Mouth/Throat: Oropharynx is clear and moist.  Eyes: Conjunctivae and EOM are normal. Pupils are equal, round, and reactive to light.  Neck: Normal range of motion. Neck supple. No JVD present. No tracheal deviation present. No thyromegaly present.  Cardiovascular: Normal rate and regular rhythm.   No murmur heard. Pulmonary/Chest: Effort normal and breath sounds normal. She has no wheezes. She exhibits no tenderness.  Abdominal: Soft. Bowel sounds are normal.  Musculoskeletal:  Normal range of motion. She exhibits edema. She exhibits no tenderness.  Lymphadenopathy:    She has no cervical adenopathy.  Neurological: She is alert and oriented to person, place, and time. She has normal reflexes. No cranial nerve deficit.  Skin: Skin is warm and dry. Rash noted. She is not diaphoretic.  Breast rash  Psychiatric: She has a normal mood and affect. Her behavior is normal.          Assessment & Plan:  Left ankle swelling due to sedentary work  As needed diuretic vs compression hose she prefers diuretics complicated by obesity bactroban under breast cetophil cleanser

## 2013-04-18 NOTE — Patient Instructions (Signed)
The patient is instructed to continue all medications as prescribed. Schedule followup with check out clerk upon leaving the clinic  

## 2013-04-28 ENCOUNTER — Telehealth: Payer: Self-pay | Admitting: *Deleted

## 2013-04-28 MED ORDER — HYDROCHLOROTHIAZIDE 25 MG PO TABS: 25.0000 mg | ORAL_TABLET | Freq: Every day | ORAL | Status: DC

## 2013-04-28 NOTE — Telephone Encounter (Signed)
Pt call and stats the spironolactone is not helping swelling in legs-  Wants to try hctz 25 qd--ok per dr Lovell Sheehan

## 2013-05-15 ENCOUNTER — Encounter: Payer: Self-pay | Admitting: *Deleted

## 2013-05-15 ENCOUNTER — Other Ambulatory Visit: Payer: BC Managed Care – PPO

## 2013-05-15 LAB — BASIC METABOLIC PANEL
CO2: 29 mEq/L (ref 19–32)
Chloride: 101 mEq/L (ref 96–112)
Creatinine, Ser: 0.9 mg/dL (ref 0.4–1.2)
Potassium: 3.5 mEq/L (ref 3.5–5.1)

## 2013-05-17 ENCOUNTER — Encounter: Payer: Self-pay | Admitting: Internal Medicine

## 2013-05-19 ENCOUNTER — Other Ambulatory Visit: Payer: Self-pay | Admitting: *Deleted

## 2013-05-19 MED ORDER — HYDROCHLOROTHIAZIDE 25 MG PO TABS: 25.0000 mg | ORAL_TABLET | Freq: Every day | ORAL | Status: DC

## 2013-06-02 ENCOUNTER — Other Ambulatory Visit: Payer: Self-pay | Admitting: Internal Medicine

## 2013-07-10 ENCOUNTER — Other Ambulatory Visit: Payer: Self-pay

## 2013-08-08 ENCOUNTER — Ambulatory Visit (INDEPENDENT_AMBULATORY_CARE_PROVIDER_SITE_OTHER): Payer: BC Managed Care – PPO | Admitting: Internal Medicine

## 2013-08-08 ENCOUNTER — Encounter: Payer: Self-pay | Admitting: Internal Medicine

## 2013-08-08 ENCOUNTER — Telehealth: Payer: Self-pay | Admitting: Internal Medicine

## 2013-08-08 ENCOUNTER — Other Ambulatory Visit: Payer: Self-pay | Admitting: *Deleted

## 2013-08-08 ENCOUNTER — Ambulatory Visit: Payer: BC Managed Care – PPO | Admitting: Internal Medicine

## 2013-08-08 VITALS — BP 140/90 | HR 80 | Temp 98.1°F | Resp 18 | Ht 64.0 in | Wt 315.0 lb

## 2013-08-08 DIAGNOSIS — L02219 Cutaneous abscess of trunk, unspecified: Secondary | ICD-10-CM

## 2013-08-08 DIAGNOSIS — Z0000 Encounter for general adult medical examination without abnormal findings: Secondary | ICD-10-CM

## 2013-08-08 MED ORDER — DOXYCYCLINE HYCLATE 100 MG PO TABS: 100.0000 mg | ORAL_TABLET | Freq: Two times a day (BID) | ORAL | Status: DC

## 2013-08-08 NOTE — Telephone Encounter (Signed)
done

## 2013-08-08 NOTE — Progress Notes (Signed)
Subjective:    Patient ID: Samantha Boone, female    DOB: Nov 14, 1960, 52 y.o.   MRN: [id number]  Hypertension  Hyperlipidemia  Atrial Fibrillation Symptoms include hypertension. Past medical history includes atrial fibrillation and hyperlipidemia.   Travel near anniversary fo mother death and ETOH with episode of AF that converted with one flecainide and metoprolol   Review of Systems  Constitutional: Positive for appetite change and fatigue.  HENT: Positive for postnasal drip.   Cardiovascular: Positive for leg swelling.  Endocrine: Negative.   Genitourinary: Negative.    Past Medical History  Diagnosis Date  . Allergy   . Anemia   . GERD (gastroesophageal reflux disease)   . Headache(784.0)   . Hyperlipidemia   . Hypertension   . Arthritis   . Hx: UTI (urinary tract infection)   . Atrial fibrillation     History   Social History  . Marital Status: Married    Spouse Name: N/A    Number of Children: N/A  . Years of Education: N/A   Occupational History  . Not on file.   Social History Main Topics  . Smoking status: Former Games developer  . Smokeless tobacco: Not on file  . Alcohol Use: Yes  . Drug Use: No  . Sexual Activity: Yes   Other Topics Concern  . Not on file   Social History Narrative  . No narrative on file    Past Surgical History  Procedure Laterality Date  . Fibroid removed    . Uterine ablation      Family History  Problem Relation Age of Onset  . Prostate cancer    . Hyperlipidemia Mother   . Hypertension Mother   . Arthritis Mother   . Hypertension Father   . Hyperlipidemia Father   . Heart disease Father     No Known Allergies  Current Outpatient Prescriptions on File Prior to Visit  Medication Sig Dispense Refill  . aspirin 325 MG tablet Take 325 mg by mouth daily.        . Calcium Carbonate (CALTRATE 600 PO) Take 1 tablet by mouth daily.       . chlorpheniramine (CHLOR-TRIMETON) 4 MG tablet Take 4 mg by mouth daily as  needed.        . Cholecalciferol (VITAMIN D3) 50000 UNITS CAPS Take 1 capsule by mouth once a week  24 capsule  98  . clindamycin (CLEOCIN-T) 1 % lotion Apply topically 2 (two) times daily.  180 mL  2  . esomeprazole (NEXIUM) 40 MG capsule Take 1 capsule (40 mg total) by mouth daily.  90 capsule  3  . ezetimibe (ZETIA) 10 MG tablet Take 10 mg by mouth. 1/2 daily        . flecainide (TAMBOCOR) 50 MG tablet Take 50 mg by mouth. Take 3 tabs for AFIB, may repeat 3 tablets by mouth in 2 hours for AFIB       . fluticasone (FLONASE) 50 MCG/ACT nasal spray Place 2 sprays into the nose daily. 2 sprays in each nostril  48 g  3  . glucosamine-chondroitin 500-400 MG tablet Take 1 tablet by mouth 2 (two) times daily.       . hydrochlorothiazide (HYDRODIURIL) 25 MG tablet Take 1 tablet (25 mg total) by mouth daily.  90 tablet  3  . KRILL OIL 1000 MG CAPS Take by mouth daily.        Marland Kitchen losartan (COZAAR) 100 MG tablet Take 1 tablet (100 mg total)  by mouth daily.  90 tablet  3  . metoprolol succinate (TOPROL-XL) 50 MG 24 hr tablet       . metoprolol tartrate (LOPRESSOR) 25 MG tablet Take 25 mg by mouth 2 (two) times daily as needed.      . mupirocin cream (BACTROBAN) 2 % Apply topically 2 (two) times daily.  30 g  1  . paraben-cetyl & stearyl alcohol (CETAPHIL CLEANSER) external solution Apply topically as needed.  240 mL  0  . Red Yeast Rice 600 MG CAPS Take by mouth 2 (two) times daily.        Marland Kitchen URELLE (URELLE/URISED) 81 MG TABS Take 1 tablet (81 mg total) by mouth every 6 (six) hours as needed.  120 each  3  . buPROPion (WELLBUTRIN XL) 150 MG 24 hr tablet Take 1 tablet (150 mg total) by mouth daily.  90 tablet  3   No current facility-administered medications on file prior to visit.    BP 140/90  Pulse 80  Temp(Src) 98.1 F (36.7 C)  Resp 18  Ht 5\' 4"  (1.626 m)  Wt 315 lb (142.883 kg)  BMI 54.04 kg/m2       Objective:   Physical Exam  Nursing note and vitals reviewed. Constitutional: She is  oriented to person, place, and time. She appears well-developed and well-nourished.  HENT:  Head: Normocephalic and atraumatic.  Cardiovascular: Normal rate and regular rhythm.   Pulmonary/Chest: Effort normal and breath sounds normal.  Abdominal: Bowel sounds are normal.  Neurological: She is alert and oriented to person, place, and time.          Assessment & Plan:  PAT episode Flecainide worked  history of mastitis  And took levoquin and topicals Doxycycline 100 BID

## 2013-08-08 NOTE — Patient Instructions (Signed)
The patient is instructed to continue all medications as prescribed. Schedule followup with check out clerk upon leaving the clinic  

## 2013-08-08 NOTE — Progress Notes (Signed)
Pre visit review using our clinic review tool, if applicable. No additional management support is needed unless otherwise documented below in the visit note. 

## 2013-08-08 NOTE — Telephone Encounter (Signed)
Pt is going to have cpx lab drawn at [facility], can you please enter the orders.

## 2013-08-17 ENCOUNTER — Encounter: Payer: Self-pay | Admitting: Internal Medicine

## 2013-08-18 ENCOUNTER — Encounter: Payer: Self-pay | Admitting: *Deleted

## 2013-08-25 ENCOUNTER — Other Ambulatory Visit: Payer: Self-pay | Admitting: *Deleted

## 2013-08-25 MED ORDER — METOPROLOL SUCCINATE ER 50 MG PO TB24: 50.0000 mg | ORAL_TABLET | Freq: Every day | ORAL | Status: DC

## 2013-08-26 ENCOUNTER — Ambulatory Visit (INDEPENDENT_AMBULATORY_CARE_PROVIDER_SITE_OTHER): Payer: BC Managed Care – PPO

## 2013-08-26 VITALS — BP 136/77 | HR 62 | Resp 12 | Ht 63.0 in | Wt 312.0 lb

## 2013-08-26 DIAGNOSIS — M722 Plantar fascial fibromatosis: Secondary | ICD-10-CM

## 2013-08-26 DIAGNOSIS — M2040 Other hammer toe(s) (acquired), unspecified foot: Secondary | ICD-10-CM

## 2013-08-26 DIAGNOSIS — R52 Pain, unspecified: Secondary | ICD-10-CM

## 2013-08-26 DIAGNOSIS — M2041 Other hammer toe(s) (acquired), right foot: Secondary | ICD-10-CM

## 2013-08-26 MED ORDER — TRIAMCINOLONE ACETONIDE 10 MG/ML IJ SUSP: 10.0000 mg | Freq: Once | INTRAMUSCULAR | Status: DC

## 2013-08-26 NOTE — Progress Notes (Signed)
   Subjective:    Patient ID: Samantha Boone, female    DOB: [birth year], 52 y.o.   MRN: [id number]  HPI Comments: '' RT FOOT 2ND TOE IS BEEN HURTING ABOUT A MONTH. TREATMENT TRIED JELL TOE SEPARATOR BUT NOT HELPING.  ALSO, LT FOOT HEEL IS BURNING AND PAINFUL FOR 2 WEEKS. BUT TRIED NO TREATMENT.''     Review of Systems  Eyes: Positive for itching.  Musculoskeletal: Positive for gait problem.  Allergic/Immunologic: Positive for environmental allergies.  All other systems reviewed and are negative.       Objective:   Physical Exam Neurovascular status is intact and symmetric bilateral. 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 She's tried wearing a buttress pad however that she aggravated it was rubbing Morgan's top of the shoe. Her other problem is having recalcitrant heel pain has had a re\re exacerbation of her left heel and arch pain did use some yardwork recently which may have exacerbated the symptoms having pain on first up in the morning or getting up from. Rest. Patient currently wears wears orthoses power step orthotics. Exhibits successful up until now.       Assessment & Plan:  Assessment this time hammertoe deformity right foot second digit flexible nature dispensed and tubercle padding for cushioning. 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 Based in symptomology on injection tender with Kenalog 20 mg Xylocaine plain infiltrated to the inferior left heel also adjusted the orthotic on the left foot with additional felt padding. Followup in one month if symptoms persist or refill to improve maintain accommodative shoes and orthotics at all times. Consider additional padding for toes in the future or possibility of hammertoe arthroplasty repair  if symptoms persist.  Alvan Dame DPM

## 2013-08-26 NOTE — Patient Instructions (Signed)

## 2013-10-07 ENCOUNTER — Ambulatory Visit (INDEPENDENT_AMBULATORY_CARE_PROVIDER_SITE_OTHER): Payer: BC Managed Care – PPO

## 2013-10-07 VITALS — BP 128/65 | HR 61 | Resp 12

## 2013-10-07 DIAGNOSIS — M722 Plantar fascial fibromatosis: Secondary | ICD-10-CM

## 2013-10-07 DIAGNOSIS — R52 Pain, unspecified: Secondary | ICD-10-CM

## 2013-10-07 MED ORDER — TRIAMCINOLONE ACETONIDE 10 MG/ML IJ SUSP: 10.0000 mg | Freq: Once | INTRAMUSCULAR | Status: DC

## 2013-10-07 NOTE — Patient Instructions (Signed)

## 2013-10-07 NOTE — Progress Notes (Signed)
   Subjective:    Patient ID: Samantha Boone, female    DOB: Jan 26, 1961, 53 y.o.   MRN: [id number]  HPI Comments: '' LT FOOT HEEL IS BURNING AND SORE''  patient continues to have left heel pain some burning soreness at times especially last couple days as increased felt better after her last visit she a steroid injection. 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 This time discussed options including the possibility of future surgical intervention or shockwave therapy EPAT was discussed which are dispensed the patient this time.       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 Recheck in one to 2 months for further followup consider shockwave therapy as discussed or possibly surgical intervention may be discussed further.  Alvan Dameichard Hulet Ehrmann DPM

## 2013-10-10 ENCOUNTER — Other Ambulatory Visit: Payer: Self-pay | Admitting: *Deleted

## 2013-10-10 MED ORDER — NAPROXEN SODIUM 550 MG PO TABS: 550.0000 mg | ORAL_TABLET | Freq: Two times a day (BID) | ORAL | Status: DC

## 2013-10-11 ENCOUNTER — Encounter: Payer: Self-pay | Admitting: Internal Medicine

## 2013-10-11 DIAGNOSIS — I10 Essential (primary) hypertension: Secondary | ICD-10-CM

## 2013-10-11 DIAGNOSIS — R609 Edema, unspecified: Secondary | ICD-10-CM

## 2013-10-13 MED ORDER — FUROSEMIDE 20 MG PO TABS: 20.0000 mg | ORAL_TABLET | Freq: Every day | ORAL | Status: DC

## 2013-10-13 MED ORDER — POTASSIUM CHLORIDE CRYS ER 20 MEQ PO TBCR: 10.0000 meq | EXTENDED_RELEASE_TABLET | Freq: Every day | ORAL | Status: DC

## 2013-11-07 ENCOUNTER — Other Ambulatory Visit: Payer: Self-pay | Admitting: *Deleted

## 2013-11-07 DIAGNOSIS — R609 Edema, unspecified: Secondary | ICD-10-CM

## 2013-11-07 DIAGNOSIS — I10 Essential (primary) hypertension: Secondary | ICD-10-CM

## 2013-11-07 MED ORDER — FUROSEMIDE 20 MG PO TABS: 20.0000 mg | ORAL_TABLET | Freq: Every day | ORAL | Status: DC

## 2013-11-07 MED ORDER — POTASSIUM CHLORIDE ER 10 MEQ PO TBCR: 10.0000 meq | EXTENDED_RELEASE_TABLET | Freq: Every day | ORAL | Status: DC

## 2013-11-19 LAB — HM PAP SMEAR

## 2013-12-08 ENCOUNTER — Other Ambulatory Visit: Payer: Self-pay | Admitting: *Deleted

## 2013-12-08 MED ORDER — LOSARTAN POTASSIUM 100 MG PO TABS: 100.0000 mg | ORAL_TABLET | Freq: Every day | ORAL | Status: DC

## 2013-12-11 ENCOUNTER — Other Ambulatory Visit: Payer: Self-pay | Admitting: Endocrinology

## 2013-12-11 DIAGNOSIS — E041 Nontoxic single thyroid nodule: Secondary | ICD-10-CM

## 2013-12-17 ENCOUNTER — Encounter: Payer: Self-pay | Admitting: Internal Medicine

## 2013-12-29 ENCOUNTER — Other Ambulatory Visit: Payer: Self-pay

## 2013-12-29 ENCOUNTER — Telehealth: Payer: Self-pay

## 2013-12-29 DIAGNOSIS — Z0000 Encounter for general adult medical examination without abnormal findings: Secondary | ICD-10-CM

## 2013-12-29 DIAGNOSIS — I10 Essential (primary) hypertension: Secondary | ICD-10-CM

## 2013-12-29 DIAGNOSIS — D649 Anemia, unspecified: Secondary | ICD-10-CM

## 2013-12-29 DIAGNOSIS — E785 Hyperlipidemia, unspecified: Secondary | ICD-10-CM

## 2013-12-29 NOTE — Telephone Encounter (Signed)
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  Placed physical orders for patient. She is aware.

## 2013-12-30 ENCOUNTER — Other Ambulatory Visit: Payer: BC Managed Care – PPO

## 2013-12-30 LAB — BASIC METABOLIC PANEL
BUN: 30 mg/dL — ABNORMAL HIGH (ref 6–23)
CALCIUM: 9.4 mg/dL (ref 8.4–10.5)
CO2: 28 meq/L (ref 19–32)
CREATININE: 0.8 mg/dL (ref 0.4–1.2)
Chloride: 100 mEq/L (ref 96–112)
GFR: 81.47 mL/min (ref >60.00)
GLUCOSE: 108 mg/dL — AB (ref 70–99)
Potassium: 4.3 mEq/L (ref 3.5–5.1)
Sodium: 138 mEq/L (ref 135–145)

## 2013-12-30 LAB — CBC WITH DIFFERENTIAL/PLATELET
BASOS ABS: 0 10*3/uL (ref 0.0–0.1)
Basophils Relative: 0.5 % (ref 0.0–3.0)
Eosinophils Absolute: 0.4 10*3/uL (ref 0.0–0.7)
Eosinophils Relative: 5.1 % — ABNORMAL HIGH (ref 0.0–5.0)
HCT: 34.3 % — ABNORMAL LOW (ref 36.0–46.0)
HEMOGLOBIN: 11.3 g/dL — AB (ref 12.0–15.0)
LYMPHS PCT: 24.7 % (ref 12.0–46.0)
Lymphs Abs: 2 10*3/uL (ref 0.7–4.0)
MCHC: 33 g/dL (ref 30.0–36.0)
MCV: 83.2 fl (ref 78.0–100.0)
MONOS PCT: 6.7 % (ref 3.0–12.0)
Monocytes Absolute: 0.5 10*3/uL (ref 0.1–1.0)
Neutro Abs: 5 10*3/uL (ref 1.4–7.7)
Neutrophils Relative %: 63 % (ref 43.0–77.0)
PLATELETS: 333 10*3/uL (ref 150.0–400.0)
RBC: 4.12 Mil/uL (ref 3.87–5.11)
RDW: 15.3 % — AB (ref 11.5–14.6)
WBC: 7.9 10*3/uL (ref 4.5–10.5)

## 2013-12-30 LAB — LIPID PANEL
CHOL/HDL RATIO: 4
Cholesterol: 176 mg/dL (ref 0–200)
HDL: 41.9 mg/dL (ref >39.00)
LDL CALC: 96 mg/dL (ref 0–99)
Triglycerides: 190 mg/dL — ABNORMAL HIGH (ref 0.0–149.0)
VLDL: 38 mg/dL (ref 0.0–40.0)

## 2013-12-30 LAB — HEPATIC FUNCTION PANEL
ALK PHOS: 71 U/L (ref 39–117)
ALT: 23 U/L (ref 0–35)
AST: 18 U/L (ref 0–37)
Albumin: 3.9 g/dL (ref 3.5–5.2)
BILIRUBIN TOTAL: 0.4 mg/dL (ref 0.3–1.2)
Bilirubin, Direct: 0 mg/dL (ref 0.0–0.3)
Total Protein: 7.3 g/dL (ref 6.0–8.3)

## 2013-12-30 LAB — TSH: TSH: 0.84 u[IU]/mL (ref 0.35–5.50)

## 2014-01-02 ENCOUNTER — Encounter: Payer: Self-pay | Admitting: Internal Medicine

## 2014-01-02 ENCOUNTER — Encounter: Payer: BC Managed Care – PPO | Admitting: Internal Medicine

## 2014-01-02 ENCOUNTER — Ambulatory Visit (INDEPENDENT_AMBULATORY_CARE_PROVIDER_SITE_OTHER): Payer: BC Managed Care – PPO | Admitting: Internal Medicine

## 2014-01-02 VITALS — BP 130/90 | HR 68 | Temp 97.6°F | Ht 63.5 in | Wt 315.0 lb

## 2014-01-02 DIAGNOSIS — Z0000 Encounter for general adult medical examination without abnormal findings: Secondary | ICD-10-CM

## 2014-01-02 DIAGNOSIS — D649 Anemia, unspecified: Secondary | ICD-10-CM

## 2014-01-02 LAB — RETICULOCYTES
ABS Retic: 75.1 10*3/uL (ref 19.0–186.0)
RBC.: 4.17 MIL/uL (ref 3.87–5.11)
RETIC CT PCT: 1.8 % (ref 0.4–2.3)

## 2014-01-02 LAB — VITAMIN B12: Vitamin B-12: 245 pg/mL (ref 211–911)

## 2014-01-02 LAB — IRON: IRON: 39 ug/dL — AB (ref 42–145)

## 2014-01-02 MED ORDER — LORCASERIN HCL 10 MG PO TABS: 1.0000 | ORAL_TABLET | Freq: Two times a day (BID) | ORAL | Status: DC

## 2014-01-02 MED ORDER — CEPHALEXIN 500 MG PO CAPS: 500.0000 mg | ORAL_CAPSULE | Freq: Four times a day (QID) | ORAL | Status: DC

## 2014-01-02 NOTE — Patient Instructions (Signed)
The patient is instructed to continue all medications as prescribed. Schedule followup with check out clerk upon leaving the clinic  

## 2014-01-02 NOTE — Progress Notes (Signed)
Subjective:    Patient ID: Samantha Boone, female    DOB: 1960/11/12, 53 y.o.   MRN: [id number]  HPI  Discussion of weight loss and medication Newer drugs for weight loss  belviq CPX  Review of Systems  Constitutional: Negative for activity change, appetite change and fatigue.  HENT: Negative for congestion, ear pain, postnasal drip and sinus pressure.   Eyes: Negative for redness and visual disturbance.  Respiratory: Negative for cough, shortness of breath and wheezing.   Gastrointestinal: Negative for abdominal pain and abdominal distention.  Genitourinary: Negative for dysuria, frequency and menstrual problem.  Musculoskeletal: Negative for arthralgias, joint swelling, myalgias and neck pain.  Skin: Negative for rash and wound.  Neurological: Negative for dizziness, weakness and headaches.  Hematological: Negative for adenopathy. Does not bruise/bleed easily.  Psychiatric/Behavioral: Negative for sleep disturbance and decreased concentration.       Past Medical History  Diagnosis Date  . Allergy   . Anemia   . GERD (gastroesophageal reflux disease)   . Headache(784.0)   . Hyperlipidemia   . Hypertension   . Arthritis   . Hx: UTI (urinary tract infection)   . Atrial fibrillation     History   Social History  . Marital Status: Married    Spouse Name: N/A    Number of Children: N/A  . Years of Education: N/A   Occupational History  . Not on file.   Social History Main Topics  . Smoking status: Never Smoker   . Smokeless tobacco: Not on file  . Alcohol Use: Yes  . Drug Use: No  . Sexual Activity: Yes   Other Topics Concern  . Not on file   Social History Narrative  . No narrative on file    Past Surgical History  Procedure Laterality Date  . Fibroid removed    . Uterine ablation      Family History  Problem Relation Age of Onset  . Prostate cancer    . Hyperlipidemia Mother   . Hypertension Mother   . Arthritis Mother   . Hypertension  Father   . Hyperlipidemia Father   . Heart disease Father     No Known Allergies  Current Outpatient Prescriptions on File Prior to Visit  Medication Sig Dispense Refill  . aspirin 325 MG tablet Take 325 mg by mouth daily.        . Calcium Carbonate (CALTRATE 600 PO) Take 1 tablet by mouth daily.       . chlorpheniramine (CHLOR-TRIMETON) 4 MG tablet Take 4 mg by mouth daily as needed.        . Cholecalciferol (VITAMIN D3) 50000 UNITS CAPS Take 1 capsule by mouth once a week  24 capsule  98  . clindamycin (CLEOCIN-T) 1 % lotion Apply topically 2 (two) times daily.  180 mL  2  . esomeprazole (NEXIUM) 40 MG capsule Take 1 capsule (40 mg total) by mouth daily.  90 capsule  3  . ezetimibe (ZETIA) 10 MG tablet Take 10 mg by mouth. 1/2 daily        . flecainide (TAMBOCOR) 50 MG tablet Take 50 mg by mouth. Take 3 tabs for AFIB, may repeat 3 tablets by mouth in 2 hours for AFIB       . fluticasone (FLONASE) 50 MCG/ACT nasal spray Place 2 sprays into the nose daily. 2 sprays in each nostril  48 g  3  . furosemide (LASIX) 20 MG tablet Take 1 tablet (20 mg total) by  mouth daily.  90 tablet  3  . glucosamine-chondroitin 500-400 MG tablet Take 1 tablet by mouth 2 (two) times daily.       Marland Kitchen. KRILL OIL 1000 MG CAPS Take by mouth daily.        Marland Kitchen. losartan (COZAAR) 100 MG tablet Take 1 tablet (100 mg total) by mouth daily.  90 tablet  3  . metoprolol succinate (TOPROL-XL) 50 MG 24 hr tablet Take 1 tablet (50 mg total) by mouth daily.  90 tablet  3  . metoprolol tartrate (LOPRESSOR) 25 MG tablet Take 25 mg by mouth 2 (two) times daily as needed.      . mupirocin cream (BACTROBAN) 2 % Apply topically 2 (two) times daily.  30 g  1  . naproxen sodium (ANAPROX DS) 550 MG tablet Take 1 tablet (550 mg total) by mouth 2 (two) times daily with a meal.  180 tablet  3  . paraben-cetyl & stearyl alcohol (CETAPHIL CLEANSER) external solution Apply topically as needed.  240 mL  0  . potassium chloride (K-DUR) 10 MEQ  tablet Take 1 tablet (10 mEq total) by mouth daily.  90 tablet  3  . Red Yeast Rice 600 MG CAPS Take by mouth 2 (two) times daily.        Marland Kitchen. URELLE (URELLE/URISED) 81 MG TABS Take 1 tablet (81 mg total) by mouth every 6 (six) hours as needed.  120 each  3   Current Facility-Administered Medications on File Prior to Visit  Medication Dose Route Frequency Provider Last Rate Last Dose  . triamcinolone acetonide (KENALOG) 10 MG/ML injection 10 mg  10 mg Other Once Standard Pacificichard Sikora, DPM      . triamcinolone acetonide (KENALOG) 10 MG/ML injection 10 mg  10 mg Other Once Richard Sikora, DPM        BP 130/90  Pulse 68  Temp(Src) 97.6 F (36.4 C) (Oral)  Ht 5' 3.5" (1.613 m)  Wt 315 lb (142.883 kg)  BMI 54.92 kg/m2    Objective:   Physical Exam  Constitutional: She is oriented to person, place, and time. She appears well-developed and well-nourished.  HENT:  Head: Atraumatic.  Neck: Normal range of motion. Neck supple.  Cardiovascular: Normal rate and regular rhythm.   Murmur heard. Pulmonary/Chest: Effort normal and breath sounds normal.  Abdominal: Soft. Bowel sounds are normal.  Musculoskeletal: Normal range of motion.  Neurological: She is alert and oriented to person, place, and time.  Skin: Skin is warm and dry.          Assessment & Plan:  Belviq trial for weight loss   This is a routine wellness  examination for this patient . I reviewed all health maintenance protocols including mammography, colonoscopy, bone density Needed referrals were placed. Age and diagnosis  appropriate screening labs were ordered. Her immunization history was reviewed and appropriate vaccinations were ordered. Her current medications and allergies were reviewed and needed refills of her chronic medications were ordered. The plan for yearly health maintenance was discussed all orders and referrals were made as appropriate.   Rare Atrial fib that breaks with flecanide

## 2014-01-02 NOTE — Progress Notes (Signed)
Pre visit review using our clinic review tool, if applicable. No additional management support is needed unless otherwise documented below in the visit note. 

## 2014-01-02 NOTE — Addendum Note (Signed)
Addended by: Rita OharaHRASHER, Ama Mcmaster R on: 01/02/2014 11:19 AM   Modules accepted: Orders

## 2014-01-02 NOTE — Addendum Note (Signed)
Addended by: Rita OharaHRASHER, Mariza Bourget R on: 01/02/2014 11:16 AM   Modules accepted: Orders

## 2014-01-08 ENCOUNTER — Telehealth: Payer: Self-pay | Admitting: Internal Medicine

## 2014-01-08 DIAGNOSIS — D649 Anemia, unspecified: Secondary | ICD-10-CM

## 2014-01-08 NOTE — Telephone Encounter (Signed)
Pt was advised by Dr Lovell SheehanJenkins that he was going to refer her to [facility] to see  Dr Russella DarStark and has not heard anything about the referral would like to be contacted concerning referral

## 2014-01-09 NOTE — Telephone Encounter (Signed)
Didn't see a referral placed, referral order was placed per Dr Lovell SheehanJenkins dx: anemia

## 2014-01-11 ENCOUNTER — Encounter: Payer: Self-pay | Admitting: Internal Medicine

## 2014-01-27 ENCOUNTER — Encounter: Payer: Self-pay | Admitting: Gastroenterology

## 2014-02-02 ENCOUNTER — Ambulatory Visit: Payer: BC Managed Care – PPO | Admitting: Internal Medicine

## 2014-02-09 ENCOUNTER — Ambulatory Visit: Payer: BC Managed Care – PPO | Admitting: Internal Medicine

## 2014-02-09 ENCOUNTER — Encounter: Payer: Self-pay | Admitting: Family Medicine

## 2014-02-09 ENCOUNTER — Telehealth: Payer: Self-pay | Admitting: Internal Medicine

## 2014-02-09 ENCOUNTER — Ambulatory Visit (INDEPENDENT_AMBULATORY_CARE_PROVIDER_SITE_OTHER): Payer: BC Managed Care – PPO | Admitting: Family Medicine

## 2014-02-09 VITALS — BP 132/80 | HR 75 | Wt 315.0 lb

## 2014-02-09 DIAGNOSIS — M25569 Pain in unspecified knee: Secondary | ICD-10-CM

## 2014-02-09 DIAGNOSIS — M1710 Unilateral primary osteoarthritis, unspecified knee: Secondary | ICD-10-CM

## 2014-02-09 DIAGNOSIS — M1711 Unilateral primary osteoarthritis, right knee: Secondary | ICD-10-CM

## 2014-02-09 DIAGNOSIS — IMO0002 Reserved for concepts with insufficient information to code with codable children: Secondary | ICD-10-CM

## 2014-02-09 MED ORDER — METHYLPREDNISOLONE ACETATE 40 MG/ML IJ SUSP
40.0000 mg | Freq: Once | INTRAMUSCULAR | Status: AC
  Administered 2014-02-09: 40 mg via INTRA_ARTICULAR

## 2014-02-09 NOTE — Patient Instructions (Signed)
Wear and Tear Disorders of the Knee (Arthritis, Osteoarthritis)  Everyone will experience wear and tear injuries (arthritis, osteoarthritis) of the knee. These are the changes we all get as we age. They come from the joint stress of daily living. The amount of cartilage damage in your knee and your symptoms determine if you need surgery. Mild problems require approximately two months recovery time. More severe problems take several months to recover. With mild problems, your surgeon may find worn and rough cartilage surfaces. With severe changes, your surgeon may find cartilage that has completely worn away and exposed the bone. Loose bodies of bone and cartilage, bone spurs (excess bone growth), and injuries to the menisci (cushions between the large bones of your leg) are also common. All of these problems can cause pain.  For a mild wear and tear problem, rough cartilage may simply need to be shaved and smoothed. For more severe problems with areas of exposed bone, your surgeon may use an instrument for roughing up the bone surfaces to stimulate new cartilage growth. Loose bodies are usually removed. Torn menisci may be trimmed or repaired.  ABOUT THE ARTHROSCOPIC PROCEDURE  Arthroscopy is a surgical technique. It allows your orthopedic surgeon to diagnose and treat your knee injury with accuracy. The surgeon looks into your knee through a small scope. The scope is like a small (pencil-sized) telescope. Arthroscopy is less invasive than open knee surgery. You can expect a more rapid recovery. After the procedure, you will be moved to a recovery area until most of the effects of the medication have worn off. Your caregiver will discuss the test results with you.  RECOVERY  The severity of the arthritis and the type of procedure performed will determine recovery time. Other important factors include age, physical condition, medical conditions, and the type of rehabilitation program. Strengthening your muscles after  arthroscopy helps guarantee a better recovery. Follow your caregiver's instructions. Use crutches, rest, elevate, ice, and do knee exercises as instructed. Your caregivers will help you and instruct you with exercises and other physical therapy required to regain your mobility, muscle strength, and functioning following surgery. Only take over-the-counter or prescription medicines for pain, discomfort, or fever as directed by your caregiver.   SEEK MEDICAL CARE IF:   · There is increased bleeding (more than a small spot) from the wound.  · You notice redness, swelling, or increasing pain in the wound.  · Pus is coming from wound.  · You develop an unexplained oral temperature above 102° F (38.9° C) , or as your caregiver suggests.  · You notice a foul smell coming from the wound or dressing.  · You have severe pain with motion of the knee.  SEEK IMMEDIATE MEDICAL CARE IF:   · You develop a rash.  · You have difficulty breathing.  · You have any allergic problems.  MAKE SURE YOU:   · Understand these instructions.  · Will watch your condition.  · Will get help right away if you are not doing well or get worse.  Document Released: 08/18/2000 Document Revised: 11/13/2011 Document Reviewed: 01/15/2008  [employer]® Patient Information ©2014 [employer].

## 2014-02-09 NOTE — Progress Notes (Signed)
Pre visit review using our clinic review tool, if applicable. No additional management support is needed unless otherwise documented below in the visit note. 

## 2014-02-09 NOTE — Telephone Encounter (Signed)
Pt states she would like another cortisone shot in her knee.  She said she had one in December and he knee is hurting again.  Please advise

## 2014-02-09 NOTE — Progress Notes (Signed)
   Subjective:    Patient ID: Samantha Boone, female    DOB: 06/05/1961, 53 y.o.   MRN: [id number]  Knee Pain    Patient seen with right knee pain. She is specifically requesting repeat cortisone injection. She has seen orthopedist the past and states that she has known osteoarthritis of the right knee. She had cortisone injection last December which gave her several months of improvement. She states she had remote history of meniscus tear many years ago. She has long history of morbid obesity and has not been doing any physical therapy or exercises recently. No recent injury. She has taken naproxen but has had issues with GI upset at times. She hopes to start back some physical therapy soon. She knows she needs to lose some weight.  Denies any recent instability of the knee. No locking or giving way.  Past Medical History  Diagnosis Date  . Allergy   . Anemia   . GERD (gastroesophageal reflux disease)   . Headache(784.0)   . Hyperlipidemia   . Hypertension   . Arthritis   . Hx: UTI (urinary tract infection)   . Atrial fibrillation    Past Surgical History  Procedure Laterality Date  . Fibroid removed    . Uterine ablation      reports that she has never smoked. She does not have any smokeless tobacco history on file. She reports that she drinks alcohol. She reports that she does not use illicit drugs. family history includes Arthritis in her mother; Heart disease in her father; Hyperlipidemia in her father and mother; Hypertension in her father and mother; Prostate cancer in an other family member. No Known Allergies    Review of Systems  Musculoskeletal: Positive for arthralgias. Negative for gait problem.  Skin: Negative for rash.       Objective:   Physical Exam  Constitutional: She appears well-developed and well-nourished.  Cardiovascular: Normal rate.   Musculoskeletal:  Right knee reveals no effusion. She is morbidly obese and the exam is difficult. No warmth or  erythema. Moderate medial joint line tenderness. No lateral tenderness. No instability.          Assessment & Plan:  Osteoarthritis right knee. Patient has taken oral nonsteroidals without much improvement. We discussed risk and benefits of corticosteroid injection. We discussed risk including bleeding, bruising, infection. Patient consented. Knee prepped with Betadine. Using 22-gauge one and a half inch needle injected 40 mg of Depo-Medrol and 2 cc of plain Xylocaine using an inferior medial approach. Patient tolerated well. No complications. She is strongly advised to work on weight loss and get back involved with some non-weight bearing exercises such as swimming or cycling

## 2014-02-10 NOTE — Telephone Encounter (Signed)
Called and spoke with pt and she saw Dr. Caryl Never yesterday.

## 2014-03-05 ENCOUNTER — Encounter: Payer: Self-pay | Admitting: Internal Medicine

## 2014-03-05 ENCOUNTER — Ambulatory Visit (INDEPENDENT_AMBULATORY_CARE_PROVIDER_SITE_OTHER): Payer: BC Managed Care – PPO | Admitting: Internal Medicine

## 2014-03-05 VITALS — BP 138/82 | HR 57 | Ht 63.5 in | Wt 305.0 lb

## 2014-03-05 DIAGNOSIS — R609 Edema, unspecified: Secondary | ICD-10-CM

## 2014-03-05 DIAGNOSIS — E785 Hyperlipidemia, unspecified: Secondary | ICD-10-CM

## 2014-03-05 DIAGNOSIS — I4891 Unspecified atrial fibrillation: Secondary | ICD-10-CM

## 2014-03-05 DIAGNOSIS — I10 Essential (primary) hypertension: Secondary | ICD-10-CM

## 2014-03-05 LAB — CBC
HEMATOCRIT: 34.6 % — AB (ref 36.0–46.0)
Hemoglobin: 11.5 g/dL — ABNORMAL LOW (ref 12.0–15.0)
MCHC: 33.2 g/dL (ref 30.0–36.0)
MCV: 82.8 fl (ref 78.0–100.0)
Platelets: 346 10*3/uL (ref 150.0–400.0)
RBC: 4.18 Mil/uL (ref 3.87–5.11)
RDW: 15.3 % (ref 11.5–15.5)
WBC: 9.9 10*3/uL (ref 4.0–10.5)

## 2014-03-05 LAB — MICROALBUMIN / CREATININE URINE RATIO
Creatinine,U: 99.3 mg/dL
MICROALB UR: 36.7 mg/dL — AB (ref 0.0–1.9)
Microalb Creat Ratio: 37 mg/g — ABNORMAL HIGH (ref 0.0–30.0)

## 2014-03-05 NOTE — Progress Notes (Addendum)
HPI  Patient is a 53 year old with a history of HTN, dyslipidemia and PAF (first and only episode in January 2012)  Last seen in clinic in Jan 2013 Since I saw her she has had 2 spells of atrial fibrillaton  ONe occurred last fall and the 2nd occurred this spring.  Both she says came after episodes of extreme mental stress (death of family member,  Stress at work and home).  Both she woke up with  First spell converted to SR after 1 dose of flecanide (and po lopressor).  Second spell took 2 spells.  She denies any other spells  There was one time when she felt stressed and her heart start to race some.  She took a Xanax and sensation eased without ever developing afib. Otherwise she has felt good  Denies CP  No SOB  SHe is starting PT for her knee.   Also starting the [employer] [employer] diet.  Has lost 12 lb so far.   No Known Allergies  Current Outpatient Prescriptions  Medication Sig Dispense Refill  . aspirin 325 MG tablet Take 325 mg by mouth daily.        . Calcium Carbonate (CALTRATE 600 PO) Take 1 tablet by mouth daily.       . Cholecalciferol (VITAMIN D3) 50000 UNITS CAPS Take 1 capsule by mouth once a week  24 capsule  98  . clindamycin (CLEOCIN-T) 1 % lotion Apply topically 2 (two) times daily.  180 mL  2  . esomeprazole (NEXIUM) 40 MG capsule Take 1 capsule (40 mg total) by mouth daily.  90 capsule  3  . ezetimibe (ZETIA) 10 MG tablet Take 10 mg by mouth. 1/2 daily        . flecainide (TAMBOCOR) 50 MG tablet Take 50 mg by mouth. Take 3 tabs for AFIB, may repeat 3 tablets by mouth in 2 hours for AFIB       . fluticasone (FLONASE) 50 MCG/ACT nasal spray Place 2 sprays into the nose daily. 2 sprays in each nostril  48 g  3  . furosemide (LASIX) 20 MG tablet Take 1 tablet (20 mg total) by mouth daily.  90 tablet  3  . glucosamine-chondroitin 500-400 MG tablet Take 1 tablet by mouth 2 (two) times daily.       Marland Kitchen. losartan (COZAAR) 100 MG tablet Take 1 tablet (100 mg total) by mouth daily.  90  tablet  3  . metoprolol succinate (TOPROL-XL) 50 MG 24 hr tablet Take 1 tablet (50 mg total) by mouth daily.  90 tablet  3  . metoprolol tartrate (LOPRESSOR) 25 MG tablet Take 25 mg by mouth 2 (two) times daily as needed.      . mupirocin cream (BACTROBAN) 2 % Apply topically 2 (two) times daily.  30 g  1  . naproxen sodium (ANAPROX DS) 550 MG tablet Take 1 tablet (550 mg total) by mouth 2 (two) times daily with a meal.  180 tablet  3  . paraben-cetyl & stearyl alcohol (CETAPHIL CLEANSER) external solution Apply topically as needed.  240 mL  0  . potassium chloride (K-DUR) 10 MEQ tablet Take 1 tablet (10 mEq total) by mouth daily.  90 tablet  3  . Red Yeast Rice 600 MG CAPS Take by mouth 2 (two) times daily.        . cephALEXin (KEFLEX) 500 MG capsule Take 1 capsule (500 mg total) by mouth 4 (four) times daily.  28 capsule  6  Current Facility-Administered Medications  Medication Dose Route Frequency Provider Last Rate Last Dose  . triamcinolone acetonide (KENALOG) 10 MG/ML injection 10 mg  10 mg Other Once Alvan Dameichard Sikora, DPM      . triamcinolone acetonide (KENALOG) 10 MG/ML injection 10 mg  10 mg Other Once Alvan Dameichard Sikora, DPM        Past Medical History  Diagnosis Date  . Allergy   . Anemia   . GERD (gastroesophageal reflux disease)   . Headache(784.0)   . Hyperlipidemia   . Hypertension   . Arthritis   . Hx: UTI (urinary tract infection)   . Atrial fibrillation     Past Surgical History  Procedure Laterality Date  . Fibroid removed    . Uterine ablation      Family History  Problem Relation Age of Onset  . Prostate cancer    . Hyperlipidemia Mother   . Hypertension Mother   . Arthritis Mother   . Hypertension Father   . Hyperlipidemia Father   . Heart disease Father     History   Social History  . Marital Status: Married    Spouse Name: N/A    Number of Children: N/A  . Years of Education: N/A   Occupational History  . Not on file.   Social History Main  Topics  . Smoking status: Never Smoker   . Smokeless tobacco: Not on file  . Alcohol Use: Yes  . Drug Use: No  . Sexual Activity: Yes   Other Topics Concern  . Not on file   Social History Narrative  . No narrative on file    Review of Systems:  All systems reviewed.  They are negative to the above problem except as previously stated.  Vital Signs: BP 138/82  Pulse 57  Ht 5' 3.5" (1.613 m)  Wt 305 lb (138.347 kg)  BMI 53.17 kg/m2  Physical Exam  Patient is an obese 53 yo in NAD HEENT:  Normocephalic, atraumatic. EOMI, PERRLA.  Neck: JVP is normal.  No bruits.  Lungs: clear to auscultation. No rales no wheezes.  Heart: Regular rate and rhythm. Normal S1, S2. No S3.   No significant murmurs. PMI not displaced.  Abdomen:  Supple, nontender. Normal bowel sounds. No masses. No hepatomegaly.  Extremities:   Good distal pulses throughout. No lower extremity edema.  Musculoskeletal :moving all extremities.  Neuro:   alert and oriented x3.  CN II-XII grossly intact.  EKG:  SB 57 bpm     Assessment and Plan:  1.  Atrial fibrillation.  The patient has had a copule spells that she has sensed in the past year.  She says she is very aware of when she is in it. Both resolved with flecanide  Both seem to be temporally related to increased mental stress. She has tried xanax x 1 and it seemed to help.  WOuld like to try again We discussed medications  Her score by CHADs2Vasc is 2  She should be on anticoag.  She does not want to take.  Will consider if she has another spell despite treating anxiety/stress  2.  HTN  Adequate control  3  Hx proteinuria  Will check urine ratio  She has not been seen by Dr Eliott Nineunham in years.  4.  Morbid obesity  She is determined to lose wt.  Will follow.

## 2014-03-05 NOTE — Patient Instructions (Addendum)
Your physician recommends that you continue on your current medications as directed. Please refer to the Current Medication list given to you today.  Your physician recommends that you return for lab work TODAY (CBC, UA)  Your physician wants you to follow-up in: 1 year with Dr. Tenny Crawoss.   You will receive a reminder letter in the mail two months in advance. If you don't receive a letter, please call our office to schedule the follow-up appointment.

## 2014-03-09 ENCOUNTER — Other Ambulatory Visit: Payer: Self-pay

## 2014-03-09 DIAGNOSIS — Z1231 Encounter for screening mammogram for malignant neoplasm of breast: Secondary | ICD-10-CM

## 2014-03-23 DIAGNOSIS — M722 Plantar fascial fibromatosis: Secondary | ICD-10-CM

## 2014-03-24 ENCOUNTER — Telehealth: Payer: Self-pay | Admitting: Internal Medicine

## 2014-03-24 ENCOUNTER — Ambulatory Visit: Payer: BC Managed Care – PPO | Admitting: Family

## 2014-03-24 MED ORDER — FLUTICASONE PROPIONATE 50 MCG/ACT NA SUSP: 2.0000 | Freq: Every day | NASAL | Status: DC

## 2014-03-24 NOTE — Telephone Encounter (Signed)
[facility] - [place], [place] - [address] is requesting 90 day re-fill on fluticasone (FLONASE) 50 MCG/ACT nasal spray ° °

## 2014-03-24 NOTE — Telephone Encounter (Signed)
rx sent in electronically 

## 2014-04-06 ENCOUNTER — Ambulatory Visit: Payer: BC Managed Care – PPO | Admitting: Gastroenterology

## 2014-04-06 ENCOUNTER — Other Ambulatory Visit: Payer: BC Managed Care – PPO

## 2014-04-08 ENCOUNTER — Ambulatory Visit
Admission: RE | Admit: 2014-04-08 | Discharge: 2014-04-08 | Disposition: A | Discharge status: Home / Self Care | Payer: BC Managed Care – PPO | Source: Ambulatory Visit | Attending: Endocrinology | Admitting: Endocrinology

## 2014-04-08 DIAGNOSIS — E041 Nontoxic single thyroid nodule: Secondary | ICD-10-CM

## 2014-04-23 ENCOUNTER — Other Ambulatory Visit (HOSPITAL_COMMUNITY): Payer: Self-pay | Admitting: Endocrinology

## 2014-04-23 DIAGNOSIS — J382 Nodules of vocal cords: Secondary | ICD-10-CM

## 2014-04-28 ENCOUNTER — Ambulatory Visit
Admission: RE | Admit: 2014-04-28 | Discharge: 2014-04-28 | Disposition: A | Discharge status: Home / Self Care | Payer: BC Managed Care – PPO | Source: Ambulatory Visit

## 2014-04-28 ENCOUNTER — Ambulatory Visit (HOSPITAL_COMMUNITY): Payer: BC Managed Care – PPO

## 2014-04-28 DIAGNOSIS — Z1231 Encounter for screening mammogram for malignant neoplasm of breast: Secondary | ICD-10-CM

## 2014-05-01 ENCOUNTER — Encounter: Payer: Self-pay | Admitting: *Deleted

## 2014-05-06 ENCOUNTER — Encounter (HOSPITAL_COMMUNITY)
Admission: RE | Admit: 2014-05-06 | Discharge: 2014-05-06 | Disposition: A | Discharge status: Home / Self Care | Payer: BC Managed Care – PPO | Source: Ambulatory Visit | Attending: Endocrinology | Admitting: Endocrinology

## 2014-05-06 DIAGNOSIS — J383 Other diseases of vocal cords: Secondary | ICD-10-CM | POA: Insufficient documentation

## 2014-05-06 DIAGNOSIS — J382 Nodules of vocal cords: Secondary | ICD-10-CM

## 2014-05-06 MED ORDER — SODIUM PERTECHNETATE TC 99M INJECTION
10.9000 | Freq: Once | INTRAVENOUS | Status: AC | PRN
  Administered 2014-05-06: 10.9 via INTRAVENOUS

## 2014-05-21 ENCOUNTER — Other Ambulatory Visit: Payer: Self-pay | Admitting: *Deleted

## 2014-05-21 MED ORDER — ESOMEPRAZOLE MAGNESIUM 40 MG PO CPDR: 40.0000 mg | DELAYED_RELEASE_CAPSULE | Freq: Every day | ORAL | Status: DC

## 2014-06-01 ENCOUNTER — Encounter: Payer: Self-pay | Admitting: Gastroenterology

## 2014-06-01 ENCOUNTER — Ambulatory Visit (INDEPENDENT_AMBULATORY_CARE_PROVIDER_SITE_OTHER): Payer: BC Managed Care – PPO | Admitting: Gastroenterology

## 2014-06-01 ENCOUNTER — Other Ambulatory Visit (INDEPENDENT_AMBULATORY_CARE_PROVIDER_SITE_OTHER): Payer: BC Managed Care – PPO

## 2014-06-01 VITALS — BP 134/86 | HR 72 | Ht 63.0 in | Wt 315.4 lb

## 2014-06-01 DIAGNOSIS — D509 Iron deficiency anemia, unspecified: Secondary | ICD-10-CM

## 2014-06-01 DIAGNOSIS — K219 Gastro-esophageal reflux disease without esophagitis: Secondary | ICD-10-CM

## 2014-06-01 DIAGNOSIS — F411 Generalized anxiety disorder: Secondary | ICD-10-CM

## 2014-06-01 LAB — CBC WITH DIFFERENTIAL/PLATELET
Basophils Absolute: 0 10*3/uL (ref 0.0–0.1)
Basophils Relative: 0.5 % (ref 0.0–3.0)
Eosinophils Absolute: 0.3 10*3/uL (ref 0.0–0.7)
Eosinophils Relative: 3 % (ref 0.0–5.0)
HCT: 36.4 % (ref 36.0–46.0)
Hemoglobin: 12 g/dL (ref 12.0–15.0)
Lymphocytes Relative: 25.2 % (ref 12.0–46.0)
Lymphs Abs: 2.2 10*3/uL (ref 0.7–4.0)
MCHC: 33.1 g/dL (ref 30.0–36.0)
MCV: 82.1 fl (ref 78.0–100.0)
Monocytes Absolute: 0.6 10*3/uL (ref 0.1–1.0)
Monocytes Relative: 6.6 % (ref 3.0–12.0)
Neutro Abs: 5.6 10*3/uL (ref 1.4–7.7)
Neutrophils Relative %: 64.7 % (ref 43.0–77.0)
Platelets: 351 10*3/uL (ref 150.0–400.0)
RBC: 4.43 Mil/uL (ref 3.87–5.11)
RDW: 14.7 % (ref 11.5–15.5)
WBC: 8.7 10*3/uL (ref 4.0–10.5)

## 2014-06-01 LAB — IBC PANEL
Iron: 47 ug/dL (ref 42–145)
Saturation Ratios: 10.8 % — ABNORMAL LOW (ref 20.0–50.0)
Transferrin: 312.1 mg/dL (ref 212.0–360.0)

## 2014-06-01 LAB — FERRITIN: Ferritin: 27.3 ng/mL (ref 10.0–291.0)

## 2014-06-01 MED ORDER — PEG-KCL-NACL-NASULF-NA ASC-C 100 G PO SOLR: 1.0000 | Freq: Once | ORAL | Status: DC

## 2014-06-01 MED ORDER — ALPRAZOLAM 0.25 MG PO TABS: 0.2500 mg | ORAL_TABLET | ORAL | Status: DC

## 2014-06-01 NOTE — Patient Instructions (Addendum)
Your physician has requested that you go to the basement for the following lab work before leaving today:CBC, IBC, Ferritin.   Take your Xanax right before you leave the house for your Endoscopy/ Colonoscopy.   You have been scheduled for an endoscopy and colonoscopy. Please follow the written instructions given to you at your visit today. Please pick up your prep at the pharmacy within the next 1-3 days. If you use inhalers (even only as needed), please bring them with you on the day of your procedure.  Thank you for choosing me and [facility].  Venita Lick. Pleas Koch., MD., Clementeen Graham  cc: Marquita Palms, MD

## 2014-06-01 NOTE — Progress Notes (Signed)
History of Present Illness: This is a 53 year old female accompanied by her husband. She is a Engineer, civil (consulting) with [employer]. She relates an history of iron deficiency anemia for several years due to heavy menstrual periods. Eventually she underwent a uterine ablation in 2008 and she was able to discontinue iron. In April/May a mild anemia with a hemoglobin of 11.3 was noted and her iron was low at 39 (normal range 42-145). TIBC and ferritin were not obtained. She has chronic GERD that is well controlled with daily Nexium. No other gastrointestinal complaints. She states that procedures make her anxious and occasionally she goes into atrial fibrillation due to anxiety. Denies weight loss, abdominal pain, constipation, diarrhea, change in stool caliber, melena, hematochezia, nausea, vomiting, dysphagia,chest pain.   No Known Allergies Outpatient Prescriptions Prior to Visit  Medication Sig Dispense Refill  . aspirin 325 MG tablet Take 325 mg by mouth daily.        . Calcium Carbonate (CALTRATE 600 PO) Take 1 tablet by mouth daily.       . Cholecalciferol (VITAMIN D3) 50000 UNITS CAPS Take 1 capsule by mouth once a week  24 capsule  98  . clindamycin (CLEOCIN-T) 1 % lotion Apply topically 2 (two) times daily.  180 mL  2  . esomeprazole (NEXIUM) 40 MG capsule Take 1 capsule (40 mg total) by mouth daily.  90 capsule  0  . ezetimibe (ZETIA) 10 MG tablet Take 10 mg by mouth. 1/2 daily        . flecainide (TAMBOCOR) 50 MG tablet Take 50 mg by mouth. Take 3 tabs for AFIB, may repeat 3 tablets by mouth in 2 hours for AFIB       . fluticasone (FLONASE) 50 MCG/ACT nasal spray Place 2 sprays into both nostrils daily. 2 sprays in each nostril  48 g  3  . furosemide (LASIX) 20 MG tablet Take 1 tablet (20 mg total) by mouth daily.  90 tablet  3  . glucosamine-chondroitin 500-400 MG tablet Take 1 tablet by mouth 2 (two) times daily.       Marland Kitchen losartan (COZAAR) 100 MG tablet Take 1 tablet (100 mg total) by mouth  daily.  90 tablet  3  . metoprolol succinate (TOPROL-XL) 50 MG 24 hr tablet Take 1 tablet (50 mg total) by mouth daily.  90 tablet  3  . metoprolol tartrate (LOPRESSOR) 25 MG tablet Take 25 mg by mouth 2 (two) times daily as needed.      . mupirocin cream (BACTROBAN) 2 % Apply topically 2 (two) times daily.  30 g  1  . naproxen sodium (ANAPROX DS) 550 MG tablet Take 1 tablet (550 mg total) by mouth 2 (two) times daily with a meal.  180 tablet  3  . paraben-cetyl & stearyl alcohol (CETAPHIL CLEANSER) external solution Apply topically as needed.  240 mL  0  . potassium chloride (K-DUR) 10 MEQ tablet Take 1 tablet (10 mEq total) by mouth daily.  90 tablet  3  . Red Yeast Rice 600 MG CAPS Take by mouth 2 (two) times daily.        . cephALEXin (KEFLEX) 500 MG capsule Take 1 capsule (500 mg total) by mouth 4 (four) times daily.  28 capsule  6   Facility-Administered Medications Prior to Visit  Medication Dose Route Frequency Provider Last Rate Last Dose  . triamcinolone acetonide (KENALOG) 10 MG/ML injection 10 mg  10 mg Other Once Alvan Dame, DPM      .  triamcinolone acetonide (KENALOG) 10 MG/ML injection 10 mg  10 mg Other Once Alvan Dame, DPM       Past Medical History  Diagnosis Date  . Allergy   . Anemia   . GERD (gastroesophageal reflux disease)   . Headache(784.0)   . Hyperlipidemia   . Hypertension   . Arthritis   . Hx: UTI (urinary tract infection)   . Atrial fibrillation   . Anxiety    Past Surgical History  Procedure Laterality Date  . Fibroid removed    . Uterine ablation  07/25/2007   History   Social History  . Marital Status: Married    Spouse Name: N/A    Number of Children: 0  . Years of Education: N/A   Occupational History  . RN    Social History Main Topics  . Smoking status: Never Smoker   . Smokeless tobacco: Never Used  . Alcohol Use: Yes     Comment: rarely  . Drug Use: No  . Sexual Activity: Yes   Other Topics Concern  . None   Social  History Narrative  . None   Family History  Problem Relation Age of Onset  . Prostate cancer Father   . Arthritis Mother   . Hypertension Father   . Atrial fibrillation Father   . Colon polyps Mother   . Other Mother     Westley Gambles     Review of Systems: Pertinent positive and negative review of systems were noted in the above HPI section. All other review of systems were otherwise negative.   Physical Exam: General: Well developed , well nourished, morbid obesity, no acute distress Head: Normocephalic and atraumatic Eyes:  sclerae anicteric, EOMI Ears: Normal auditory acuity Mouth: No deformity or lesions Neck: Supple, no masses or thyromegaly Lungs: Clear throughout to auscultation Heart: Regular rate and rhythm; no murmurs, rubs or bruits Abdomen: Soft, non tender and non distended. No masses, hepatosplenomegaly or hernias noted. Normal Bowel sounds Rectal: deferred to colonoscopy Musculoskeletal: Symmetrical with no gross deformities  Skin: No lesions on visible extremities Pulses:  Normal pulses noted Extremities: No clubbing, cyanosis, edema or deformities noted Neurological: Alert oriented x 4, grossly nonfocal Cervical Nodes:  No significant cervical adenopathy Inguinal Nodes: No significant inguinal adenopathy Psychological:  Alert and cooperative. Anxious.   Assessment and Recommendations:  1. Fe def anemia. Likely from chronic menstrual losses. She takes daily NSAIDs for knee pain. R/O colorectal neoplasms, AVMs, ulcer, gastritis, etc. Fe, TIBC, ferritin and CBC today. She states she will be anxious on the day of her procedures and requests Xanax prior to coming for her procedures. The risks, benefits, and alternatives to colonoscopy with possible biopsy and possible polypectomy were discussed with the patient and they consent to proceed. The risks, benefits, and alternatives to endoscopy with possible biopsy and possible dilation were discussed  with the patient and they consent to proceed.   2. GERD. Nexium 40 mg po qam. Antireflux measures. EGD as above.    3. Morbid obesity. BMI=55. Weight loss strongly recommended.   4. PAF.   5. Anxiety.

## 2014-06-02 ENCOUNTER — Encounter (HOSPITAL_COMMUNITY): Payer: Self-pay | Admitting: *Deleted

## 2014-06-02 ENCOUNTER — Other Ambulatory Visit: Payer: Self-pay

## 2014-06-02 MED ORDER — MULTIGEN 70 MG PO TABS: 1.0000 | ORAL_TABLET | Freq: Every day | ORAL | Status: DC

## 2014-06-04 ENCOUNTER — Encounter (HOSPITAL_COMMUNITY): Payer: Self-pay | Admitting: Pharmacy Technician

## 2014-06-05 ENCOUNTER — Telehealth: Payer: Self-pay | Admitting: Gastroenterology

## 2014-06-05 NOTE — Telephone Encounter (Signed)
Patient wants to cancel the procedure at the hospital for Monday.  She wants to call back and schedule for March or April

## 2014-06-08 ENCOUNTER — Ambulatory Visit (HOSPITAL_COMMUNITY)
Admission: RE | Admit: 2014-06-08 | Payer: BC Managed Care – PPO | Source: Ambulatory Visit | Admitting: Gastroenterology

## 2014-06-08 SURGERY — ESOPHAGOGASTRODUODENOSCOPY (EGD) WITH PROPOFOL
Anesthesia: Monitor Anesthesia Care

## 2014-06-16 ENCOUNTER — Telehealth: Payer: Self-pay | Admitting: Internal Medicine

## 2014-06-16 ENCOUNTER — Other Ambulatory Visit (INDEPENDENT_AMBULATORY_CARE_PROVIDER_SITE_OTHER): Payer: BC Managed Care – PPO | Admitting: *Deleted

## 2014-06-16 ENCOUNTER — Ambulatory Visit (INDEPENDENT_AMBULATORY_CARE_PROVIDER_SITE_OTHER): Payer: BC Managed Care – PPO | Admitting: Cardiovascular Disease

## 2014-06-16 ENCOUNTER — Encounter: Payer: Self-pay | Admitting: Cardiovascular Disease

## 2014-06-16 VITALS — BP 120/80 | HR 112 | Ht 62.0 in | Wt 310.0 lb

## 2014-06-16 DIAGNOSIS — I4891 Unspecified atrial fibrillation: Secondary | ICD-10-CM

## 2014-06-16 DIAGNOSIS — I483 Typical atrial flutter: Secondary | ICD-10-CM

## 2014-06-16 DIAGNOSIS — I4892 Unspecified atrial flutter: Secondary | ICD-10-CM | POA: Insufficient documentation

## 2014-06-16 MED ORDER — METOPROLOL TARTRATE 25 MG PO TABS: ORAL_TABLET | ORAL | Status: DC

## 2014-06-16 MED ORDER — LORAZEPAM 1 MG PO TABS: ORAL_TABLET | ORAL | Status: DC

## 2014-06-16 MED ORDER — FLECAINIDE ACETATE 100 MG PO TABS: ORAL_TABLET | ORAL | Status: DC

## 2014-06-16 NOTE — Addendum Note (Signed)
Addended by: Sherri RadMCGHEE, HEATHER C on: 06/16/2014 10:49 PM   Modules accepted: Orders

## 2014-06-16 NOTE — Assessment & Plan Note (Addendum)
Samantha Boone presents today with rapid atrial flutter. Her heart rate is 60 better this afternoon it was this morning.  She's been taking one aspirin every day for paroxysmal atrial fibrillation. She has a history of hypertension so her CHADS VASC score is 2.  We have started her on Eliquis 5 BID. I think our best option is to get her to steady state on her Eliquis and then do a TEE /CV on Friday (3 days from now)  She is tolerating the atrial flutter quite well at this point.   We discussed long term NOAC therapy.  Given her hx of HTN, I think that she needs to be on chronic anticoagulation.    Will set up for Samantha Boone for Friday.

## 2014-06-16 NOTE — Progress Notes (Signed)
Samantha Boone Date of Birth  01/19/1961       [facility]    [employer] [address], [address], [place], [place]  [address]   [place], [place]  [address] [address]   Fax  [phone]     Fax [phone]  Problem List: 1. Atrial fibrillation 2. Atrial flutter 3. Hypertension  History of Present Illness:  Samantha Boone is a 53 yo with hx of HTN and Paroxysmal atrial fib.  She woke up today at 5:30 with rapid , irregular HR.  She was seen as a nurse visit - found to have atrial flutter.   She took Flecainide 150 mg early this am and another 150 mg 2 hours later. She has taken Metoprolol 125 mg so far today.   She is feeling better.  Hr is better.    Current Outpatient Prescriptions on File Prior to Visit  Medication Sig Dispense Refill  . acetaminophen (TYLENOL) 500 MG tablet Take 1,000 mg by mouth once as needed for mild pain or headache.      . ALPRAZolam (XANAX) 0.25 MG tablet Take 1 tablet (0.25 mg total) by mouth as directed.  1 tablet  0  . apixaban (ELIQUIS) 5 MG TABS tablet Take 1 tablet (5 mg total) by mouth 2 (two) times daily.      Marland Kitchen aspirin 325 MG tablet Take 325 mg by mouth every morning.       . Calcium Carbonate (CALTRATE 600 PO) Take 1 tablet by mouth every morning.       . clindamycin (CLEOCIN T) 1 % lotion Apply 1 application topically 2 (two) times daily. As needed.      . doxycycline (DORYX) 100 MG DR capsule Take 100 mg by mouth 2 (two) times daily.      Marland Kitchen esomeprazole (NEXIUM) 40 MG capsule Take 40 mg by mouth every morning.      . ezetimibe (ZETIA) 10 MG tablet Take 5 mg by mouth at bedtime. 1/2 daily       . flecainide (TAMBOCOR) 50 MG tablet Take 50 mg by mouth. Take 3 tabs for AFIB, may repeat 3 tablets by mouth in 2 hours for AFIB       . fluticasone (FLONASE) 50 MCG/ACT nasal spray Place 2 sprays into both nostrils daily.      . furosemide (LASIX) 20 MG tablet Take 20 mg by mouth every  morning.      Marland Kitchen glucosamine-chondroitin 500-400 MG tablet Take 1 tablet by mouth 2 (two) times daily.       Marland Kitchen losartan (COZAAR) 100 MG tablet Take 100 mg by mouth every morning.      . metoprolol succinate (TOPROL-XL) 50 MG 24 hr tablet Take 50 mg by mouth at bedtime. Take with or immediately following a meal.      . mineral/vitamin supplement (MULTIGEN) 70 MG TABS tablet Take 1 tablet (70 mg total) by mouth daily.  180 tablet  3  . mupirocin cream (BACTROBAN) 2 % Apply topically 2 (two) times daily.  30 g  1  . naproxen sodium (ANAPROX) 550 MG tablet Take 550 mg by mouth 2 (two) times daily with a meal.      . peg 3350 powder (MOVIPREP) 100 G SOLR Take 1 kit (200 g total) by mouth once.  1 kit  0  . potassium chloride (K-DUR) 10 MEQ tablet Take 1 tablet (10 mEq total)  by mouth daily.  90 tablet  3  . Red Yeast Rice 600 MG CAPS Take by mouth 2 (two) times daily.        Marland Kitchen Soap & Cleansers (CETAPHIL GENTLE CLEANSER EX) Apply topically once as needed.      . Vitamin D, Ergocalciferol, (DRISDOL) 50000 UNITS CAPS capsule Take 50,000 Units by mouth every 7 (seven) days.       Current Facility-Administered Medications on File Prior to Visit  Medication Dose Route Frequency Provider Last Rate Last Dose  . triamcinolone acetonide (KENALOG) 10 MG/ML injection 10 mg  10 mg Other Once Harriet Masson, DPM      . triamcinolone acetonide (KENALOG) 10 MG/ML injection 10 mg  10 mg Other Once Harriet Masson, DPM        No Known Allergies  Past Medical History  Diagnosis Date  . Allergy   . Anemia   . GERD (gastroesophageal reflux disease)   . Headache(784.0)   . Hyperlipidemia   . Hypertension   . Hx: UTI (urinary tract infection)     06-02-14 none recent  . Atrial fibrillation   . Anxiety   . Arthritis     knees    Past Surgical History  Procedure Laterality Date  . Fibroid removed    . Uterine ablation  07/25/2007    History  Smoking status  . Never Smoker   Smokeless tobacco  . Never  Used    History  Alcohol Use  . Yes    Comment: rarely    Family History  Problem Relation Age of Onset  . Prostate cancer Father   . Arthritis Mother   . Hypertension Father   . Atrial fibrillation Father   . Colon polyps Mother   . Other Mother     Electa Sniff    Reviw of Systems:  Reviewed in the HPI.  All other systems are negative.  Physical Exam: Blood pressure 120/80, pulse 112, height _0  (1.575 m), weight 310 lb (140.615 kg), last menstrual period 07/25/2007. Wt Readings from Last 3 Encounters:  06/16/14 310 lb (140.615 kg)  06/01/14 315 lb 6 oz (143.053 kg)  03/05/14 305 lb (138.347 kg)     General: Well developed, well nourished, in no acute distress.  Head: Normocephalic, atraumatic, sclera non-icteric, mucus membranes are moist,   Neck: Supple. Carotids are 2 + without bruits. No JVD   Lungs: Clear   Heart: Irreg. Irreg. tachycardic  Abdomen: Soft, non-tender, non-distended with normal bowel sounds.  Msk:  Strength and tone are normal   Extremities: No clubbing or cyanosis. Trace edema in left ankle. .  Distal pedal pulses are 2+ and equal    Neuro: CN II - XII intact.  Alert and oriented X 3.   Psych:  Normal   ECG: Oct. 13, 2015:  Atrial flutter with variable AV block  Assessment / Plan:

## 2014-06-16 NOTE — Telephone Encounter (Signed)
Patient came for EKG and labs prior to DCCV. EKG shows a-flutter with a rate of 110 bpm. She was seen and evaluated by Dr. Elease HashimotoNahser. See office note.

## 2014-06-16 NOTE — Telephone Encounter (Signed)
Call received from Dr. Tenny Crawoss. Samantha Boone called her today to relay she feels like she may be back in a-fib. Per Dr. Tenny Crawoss, the patient needs to come in today for a CBC/BMP/TSH, EKG, and to start on Eliquis 5 mg BID.

## 2014-06-17 ENCOUNTER — Other Ambulatory Visit: Payer: Self-pay

## 2014-06-17 DIAGNOSIS — I4891 Unspecified atrial fibrillation: Secondary | ICD-10-CM

## 2014-06-17 LAB — CBC WITH DIFFERENTIAL/PLATELET
BASOS ABS: 0.1 10*3/uL (ref 0.0–0.1)
Basophils Relative: 1.1 % (ref 0.0–3.0)
Eosinophils Absolute: 0.3 10*3/uL (ref 0.0–0.7)
Eosinophils Relative: 2.5 % (ref 0.0–5.0)
HCT: 39.6 % (ref 36.0–46.0)
Hemoglobin: 12.7 g/dL (ref 12.0–15.0)
LYMPHS PCT: 18.2 % (ref 12.0–46.0)
Lymphs Abs: 2 10*3/uL (ref 0.7–4.0)
MCHC: 32.1 g/dL (ref 30.0–36.0)
MCV: 84.2 fl (ref 78.0–100.0)
MONOS PCT: 4.4 % (ref 3.0–12.0)
Monocytes Absolute: 0.5 10*3/uL (ref 0.1–1.0)
NEUTROS ABS: 8.1 10*3/uL — AB (ref 1.4–7.7)
NEUTROS PCT: 73.8 % (ref 43.0–77.0)
PLATELETS: 382 10*3/uL (ref 150.0–400.0)
RBC: 4.71 Mil/uL (ref 3.87–5.11)
RDW: 14.9 % (ref 11.5–15.5)
WBC: 10.9 10*3/uL — ABNORMAL HIGH (ref 4.0–10.5)

## 2014-06-17 LAB — BASIC METABOLIC PANEL
BUN: 19 mg/dL (ref 6–23)
CO2: 29 meq/L (ref 19–32)
CREATININE: 0.9 mg/dL (ref 0.4–1.2)
Calcium: 9.5 mg/dL (ref 8.4–10.5)
Chloride: 104 mEq/L (ref 96–112)
GFR: 71.7 mL/min (ref >60.00)
Glucose, Bld: 106 mg/dL — ABNORMAL HIGH (ref 70–99)
Potassium: 4.5 mEq/L (ref 3.5–5.1)
SODIUM: 138 meq/L (ref 135–145)

## 2014-06-17 LAB — TSH: TSH: 0.46 u[IU]/mL (ref 0.35–4.50)

## 2014-06-17 MED ORDER — APIXABAN 5 MG PO TABS: 5.0000 mg | ORAL_TABLET | Freq: Two times a day (BID) | ORAL | Status: DC

## 2014-06-18 ENCOUNTER — Telehealth: Payer: Self-pay | Admitting: *Deleted

## 2014-06-18 NOTE — Telephone Encounter (Signed)
I left a message for the patient to call. [facility]'s procedure that was to be scheduled for Friday for TEE/ DCCV. They were trying to see if this scheduled and needed to be precerted. In appointment history the procedure was scheduled, but cancelled for Friday. There is no documentation as to why. I asked the patient to call to follow up on symptoms and verify this should be cancelled.

## 2014-06-19 ENCOUNTER — Ambulatory Visit (HOSPITAL_COMMUNITY)
Admission: RE | Admit: 2014-06-19 | Payer: BC Managed Care – PPO | Source: Ambulatory Visit | Admitting: Cardiovascular Disease

## 2014-06-19 ENCOUNTER — Encounter (HOSPITAL_COMMUNITY): Admission: RE | Payer: Self-pay | Source: Ambulatory Visit

## 2014-06-19 SURGERY — CARDIOVERSION
Anesthesia: Monitor Anesthesia Care

## 2014-06-19 NOTE — Telephone Encounter (Signed)
No return call from the patient 

## 2014-06-23 ENCOUNTER — Telehealth: Payer: Self-pay | Admitting: Internal Medicine

## 2014-06-23 NOTE — Telephone Encounter (Signed)
Patient remains in SR  She has increased Toprol to 75 mg per day  Tolerating  Told her I spoke to J Allred and [facility]  Both (along with me) think she should be on a NOAC due [facility].  Would not start an antiarrhythmic at this time (daily) She will reflect  Did not tolerate Eliquis (rash) She said she will stay on current regimen for now.  Reluctant to take NOAC  Understands risks

## 2014-07-28 ENCOUNTER — Other Ambulatory Visit: Payer: Self-pay | Admitting: *Deleted

## 2014-07-28 MED ORDER — METOPROLOL SUCCINATE ER 50 MG PO TB24: 50.0000 mg | ORAL_TABLET | Freq: Every day | ORAL | Status: DC

## 2014-08-04 ENCOUNTER — Other Ambulatory Visit: Payer: Self-pay | Admitting: *Deleted

## 2014-08-04 MED ORDER — METOPROLOL SUCCINATE ER 50 MG PO TB24: ORAL_TABLET | ORAL | Status: DC

## 2014-08-11 ENCOUNTER — Other Ambulatory Visit: Payer: Self-pay | Admitting: *Deleted

## 2014-08-11 ENCOUNTER — Ambulatory Visit (INDEPENDENT_AMBULATORY_CARE_PROVIDER_SITE_OTHER): Payer: BC Managed Care – PPO | Admitting: Family Medicine

## 2014-08-11 ENCOUNTER — Encounter: Payer: Self-pay | Admitting: Family Medicine

## 2014-08-11 VITALS — BP 132/90 | HR 61 | Temp 98.2°F | Ht 63.5 in | Wt 309.2 lb

## 2014-08-11 DIAGNOSIS — E559 Vitamin D deficiency, unspecified: Secondary | ICD-10-CM

## 2014-08-11 DIAGNOSIS — I4891 Unspecified atrial fibrillation: Secondary | ICD-10-CM

## 2014-08-11 DIAGNOSIS — E049 Nontoxic goiter, unspecified: Secondary | ICD-10-CM

## 2014-08-11 MED ORDER — FLUTICASONE PROPIONATE 50 MCG/ACT NA SUSP: 2.0000 | Freq: Every day | NASAL | Status: DC

## 2014-08-11 MED ORDER — POTASSIUM CHLORIDE ER 10 MEQ PO TBCR: 10.0000 meq | EXTENDED_RELEASE_TABLET | Freq: Every day | ORAL | Status: DC

## 2014-08-11 MED ORDER — ESOMEPRAZOLE MAGNESIUM 40 MG PO CPDR
40.0000 mg | DELAYED_RELEASE_CAPSULE | ORAL | Status: DC
Start: 2014-08-11 — End: 2015-08-12

## 2014-08-11 MED ORDER — LORAZEPAM 1 MG PO TABS
ORAL_TABLET | ORAL | Status: DC
Start: 2014-08-11 — End: 2015-01-27

## 2014-08-11 MED ORDER — FUROSEMIDE 20 MG PO TABS: 20.0000 mg | ORAL_TABLET | ORAL | Status: DC

## 2014-08-11 MED ORDER — LORAZEPAM 1 MG PO TABS: ORAL_TABLET | ORAL | Status: DC

## 2014-08-11 NOTE — Telephone Encounter (Signed)
Medication phoned to pharmacy per Dr. Para Marchuncan.

## 2014-08-11 NOTE — Patient Instructions (Signed)
Go to the lab on the way out.  We'll contact you with your lab report. Take care. Glad to see you.   Recheck in 6 months, sooner if needed.

## 2014-08-11 NOTE — Telephone Encounter (Signed)
Dr. Para Marchuncan sent a staff message asking that I phone in the Rx for Lorazepam.  Medication phoned to pharmacy.

## 2014-08-11 NOTE — Progress Notes (Signed)
Pre visit review using our clinic review tool, if applicable. No additional management support is needed unless otherwise documented below in the visit note.  To est care, transfer care.    H/o PAF, stress related.  Usually takes prn med with effect.  Has had cards eval and f/u prev. She can tell when she is in NSR vs PAF. On ASA w/o ADE.   Didn't tolerate eliquis.    H/o vit D def, on replacement, due for f/u labs.  No ADE on med prev.   Obesity, d/w pt about weight loss with diet and exercise.  She is in PT for her B knee OA.   PMH and SH reviewed  ROS: See HPI, otherwise noncontributory.  Meds, vitals, and allergies reviewed.   GEN: nad, alert and oriented, obese HEENT: mucous membranes moist NECK: supple w/o LA CV: rrr.  no murmur PULM: ctab, no inc wob ABD: soft, +bs EXT: no edema SKIN: no acute rash

## 2014-08-12 DIAGNOSIS — E559 Vitamin D deficiency, unspecified: Secondary | ICD-10-CM | POA: Insufficient documentation

## 2014-08-12 LAB — VITAMIN D 25 HYDROXY (VIT D DEFICIENCY, FRACTURES): VITD: 44.61 ng/mL (ref 30.00–100.00)

## 2014-08-12 NOTE — Assessment & Plan Note (Signed)
Repleted, change to 1000 IU a day.  See notes on labs.

## 2014-08-13 ENCOUNTER — Encounter: Payer: Self-pay | Admitting: Family Medicine

## 2014-08-13 NOTE — Assessment & Plan Note (Signed)
Per Dr. Talmage NapBalan.  Prev with neg bx.

## 2014-08-13 NOTE — Assessment & Plan Note (Addendum)
H/o PAF, she can tell when she goes in.  D/w pt about diet and weight loss, she is down 23lbs since spring time.  >25 minutes spent in face to face time with patient, >50% spent in counselling or coordination of care.

## 2014-08-13 NOTE — Assessment & Plan Note (Signed)
D/w pt about diet and weight loss, she is down 23lbs since spring time.

## 2014-10-20 ENCOUNTER — Telehealth: Payer: Self-pay | Admitting: Gastroenterology

## 2014-10-20 NOTE — Telephone Encounter (Signed)
[facility] colon[facility] for iron def anemia last October at the hospital due to BMI 55.  She cancelled the procedure last Fall.  She is calling to reschedule.  Is it ok to reschedule direct, or does she need a recheck.  Last labs from October her Hgb was normal.

## 2014-10-21 ENCOUNTER — Other Ambulatory Visit: Payer: Self-pay

## 2014-10-21 DIAGNOSIS — D509 Iron deficiency anemia, unspecified: Secondary | ICD-10-CM

## 2014-10-21 NOTE — Telephone Encounter (Signed)
Patient is scheduled for 12/14/14 at 10:30.  She still has her paperwork and prep from last fall.  We updated the dates and times and she has her prep kit at home.

## 2014-10-21 NOTE — Telephone Encounter (Signed)
[facility] Samantha Boone is arranging hospital times on Tuesday not during hospital weeks.

## 2014-10-21 NOTE — Telephone Encounter (Signed)
Left message for patient to call back  

## 2014-12-07 ENCOUNTER — Other Ambulatory Visit: Payer: Self-pay | Admitting: Obstetrics and Gynecology

## 2014-12-07 ENCOUNTER — Encounter (HOSPITAL_COMMUNITY): Payer: Self-pay | Admitting: *Deleted

## 2014-12-08 LAB — CYTOLOGY - PAP

## 2014-12-14 ENCOUNTER — Ambulatory Visit (HOSPITAL_COMMUNITY): Payer: BLUE CROSS/BLUE SHIELD | Admitting: Anesthesiology

## 2014-12-14 ENCOUNTER — Encounter (HOSPITAL_COMMUNITY)
Admission: RE | Disposition: A | Discharge status: Home / Self Care | Payer: Self-pay | Source: Ambulatory Visit | Attending: Gastroenterology

## 2014-12-14 ENCOUNTER — Encounter (HOSPITAL_COMMUNITY): Payer: Self-pay | Admitting: Gastroenterology

## 2014-12-14 ENCOUNTER — Ambulatory Visit (HOSPITAL_COMMUNITY)
Admission: RE | Admit: 2014-12-14 | Discharge: 2014-12-14 | Disposition: A | Discharge status: Home / Self Care | Payer: BLUE CROSS/BLUE SHIELD | Source: Ambulatory Visit | Attending: Gastroenterology | Admitting: Gastroenterology

## 2014-12-14 DIAGNOSIS — Z6841 Body Mass Index (BMI) 40.0 and over, adult: Secondary | ICD-10-CM | POA: Diagnosis not present

## 2014-12-14 DIAGNOSIS — K5730 Diverticulosis of large intestine without perforation or abscess without bleeding: Secondary | ICD-10-CM | POA: Diagnosis not present

## 2014-12-14 DIAGNOSIS — K449 Diaphragmatic hernia without obstruction or gangrene: Secondary | ICD-10-CM | POA: Diagnosis not present

## 2014-12-14 DIAGNOSIS — D509 Iron deficiency anemia, unspecified: Secondary | ICD-10-CM | POA: Diagnosis not present

## 2014-12-14 DIAGNOSIS — F419 Anxiety disorder, unspecified: Secondary | ICD-10-CM | POA: Insufficient documentation

## 2014-12-14 DIAGNOSIS — M1990 Unspecified osteoarthritis, unspecified site: Secondary | ICD-10-CM | POA: Insufficient documentation

## 2014-12-14 DIAGNOSIS — K219 Gastro-esophageal reflux disease without esophagitis: Secondary | ICD-10-CM | POA: Diagnosis not present

## 2014-12-14 DIAGNOSIS — I4891 Unspecified atrial fibrillation: Secondary | ICD-10-CM | POA: Insufficient documentation

## 2014-12-14 DIAGNOSIS — E785 Hyperlipidemia, unspecified: Secondary | ICD-10-CM | POA: Diagnosis not present

## 2014-12-14 DIAGNOSIS — I10 Essential (primary) hypertension: Secondary | ICD-10-CM | POA: Insufficient documentation

## 2014-12-14 DIAGNOSIS — Z87440 Personal history of urinary (tract) infections: Secondary | ICD-10-CM | POA: Insufficient documentation

## 2014-12-14 DIAGNOSIS — K6389 Other specified diseases of intestine: Secondary | ICD-10-CM | POA: Diagnosis not present

## 2014-12-14 DIAGNOSIS — Z1211 Encounter for screening for malignant neoplasm of colon: Secondary | ICD-10-CM | POA: Insufficient documentation

## 2014-12-14 DIAGNOSIS — E049 Nontoxic goiter, unspecified: Secondary | ICD-10-CM | POA: Diagnosis not present

## 2014-12-14 DIAGNOSIS — Z7982 Long term (current) use of aspirin: Secondary | ICD-10-CM | POA: Insufficient documentation

## 2014-12-14 HISTORY — PX: ESOPHAGOGASTRODUODENOSCOPY (EGD) WITH PROPOFOL: SHX5813

## 2014-12-14 HISTORY — PX: COLONOSCOPY WITH PROPOFOL: SHX5780

## 2014-12-14 SURGERY — ESOPHAGOGASTRODUODENOSCOPY (EGD) WITH PROPOFOL
Anesthesia: Monitor Anesthesia Care

## 2014-12-14 MED ORDER — PROPOFOL 10 MG/ML IV BOLUS
INTRAVENOUS | Status: AC
  Filled 2014-12-14: qty 20

## 2014-12-14 MED ORDER — LACTATED RINGERS IV SOLN
INTRAVENOUS | Status: DC | PRN
Start: 2014-12-14 — End: 2014-12-14
  Administered 2014-12-14: 10:00:00 via INTRAVENOUS

## 2014-12-14 MED ORDER — BUTAMBEN-TETRACAINE-BENZOCAINE 2-2-14 % EX AERO
INHALATION_SPRAY | CUTANEOUS | Status: DC | PRN
  Administered 2014-12-14: 2 via TOPICAL

## 2014-12-14 MED ORDER — LACTATED RINGERS IV SOLN
INTRAVENOUS | Status: DC
  Administered 2014-12-14: 1000 mL via INTRAVENOUS

## 2014-12-14 MED ORDER — SODIUM CHLORIDE 0.9 % IV SOLN: INTRAVENOUS | Status: DC

## 2014-12-14 MED ORDER — LIDOCAINE HCL (CARDIAC) 20 MG/ML IV SOLN
INTRAVENOUS | Status: AC
  Filled 2014-12-14: qty 5

## 2014-12-14 MED ORDER — PROPOFOL INFUSION 10 MG/ML OPTIME
INTRAVENOUS | Status: DC | PRN
  Administered 2014-12-14: 140 ug/kg/min via INTRAVENOUS

## 2014-12-14 SURGICAL SUPPLY — 24 items

## 2014-12-14 NOTE — Anesthesia Preprocedure Evaluation (Signed)
Anesthesia Evaluation  Patient identified by MRN, date of birth, ID band Patient awake    Reviewed: Allergy & Precautions, NPO status , Patient's Chart, lab work & pertinent test results, reviewed documented beta blocker date and time   Airway Mallampati: II  TM Distance: >3 FB Neck ROM: Full    Dental no notable dental hx.    Pulmonary neg pulmonary ROS,  breath sounds clear to auscultation  Pulmonary exam normal       Cardiovascular hypertension, Pt. on medications and Pt. on home beta blockers Rhythm:Regular Rate:Normal     Neuro/Psych negative neurological ROS  negative psych ROS   GI/Hepatic Neg liver ROS, GERD-  ,  Endo/Other  negative endocrine ROS  Renal/GU negative Renal ROS  negative genitourinary   Musculoskeletal negative musculoskeletal ROS (+)   Abdominal   Peds negative pediatric ROS (+)  Hematology negative hematology ROS (+)   Anesthesia Other Findings   Reproductive/Obstetrics negative OB ROS                             Anesthesia Physical Anesthesia Plan  ASA: III  Anesthesia Plan: MAC   Post-op Pain Management:    Induction: Intravenous  Airway Management Planned:   Additional Equipment:   Intra-op Plan:   Post-operative Plan:   Informed Consent: I have reviewed the patients History and Physical, chart, labs and discussed the procedure including the risks, benefits and alternatives for the proposed anesthesia with the patient or authorized representative who has indicated his/her understanding and acceptance.   Dental advisory given  Plan Discussed with: CRNA  Anesthesia Plan Comments:         Anesthesia Quick Evaluation

## 2014-12-14 NOTE — H&P (Signed)
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 She relates an history of iron deficiency anemia for several years due to heavy menstrual periods. Eventually she underwent a uterine ablation in 2008 and she was able to discontinue iron. In April/May a mild anemia with a hemoglobin of 11.3 was noted and her iron was low at 39 (normal range 42-145). In October Fe saturation=10.8%, ferritin=27. She has chronic GERD that is well controlled with daily Nexium. No other gastrointestinal complaints. She states that procedures make her anxious and occasionally she goes into atrial fibrillation due to anxiety. Denies weight loss, abdominal pain, constipation, diarrhea, change in stool caliber, melena, hematochezia, nausea, vomiting, dysphagia,chest pain.  No Known Allergies                                Past Medical History  Diagnosis Date  . Allergy   . Anemia   . GERD (gastroesophageal reflux disease)   . Headache(784.0)   . Hyperlipidemia   . Hypertension   . Arthritis   . Hx: UTI (urinary tract infection)   . Atrial fibrillation   . Anxiety    Past Surgical History  Procedure Laterality Date  . Fibroid removed    . Uterine ablation  07/25/2007   History   Social History  . Marital Status: Married    Spouse Name: N/A    Number of Children: 0  . Years of Education: N/A   Occupational History  . RN    Social History Main Topics  . Smoking status: Never Smoker   . Smokeless tobacco: Never Used  . Alcohol Use: Yes     Comment: rarely  . Drug Use: No  . Sexual Activity: Yes   Other Topics Concern  . None   Social History Narrative  . None   Family History  Problem Relation Age of Onset  . Prostate cancer Father   . Arthritis Mother   . Hypertension Father   . Atrial fibrillation Father   . Colon polyps Mother    . Other Mother     Westley GamblesWaldenstram's Macroglobunemia    Review of Systems: Pertinent positive and negative review of systems were noted in the above HPI section. All other review of systems were otherwise negative.   Physical Exam: General: Well developed , well nourished, morbid obesity, no acute distress Head: Normocephalic and atraumatic Eyes: sclerae anicteric, EOMI Ears: Normal auditory acuity Mouth: No deformity or lesions Neck: Supple, no masses or thyromegaly Lungs: Clear throughout to auscultation Heart: Regular rate and rhythm; no murmurs, rubs or bruits Abdomen: Soft, non tender and non distended. No masses, hepatosplenomegaly or hernias noted. Normal Bowel sounds Rectal: deferred to colonoscopy Musculoskeletal: Symmetrical with no gross deformities  Skin: No lesions on visible extremities Pulses: Normal pulses noted Extremities: No clubbing, cyanosis, edema or deformities noted Neurological: Alert oriented x 4, grossly nonfocal Cervical Nodes: No significant cervical adenopathy Inguinal Nodes: No significant inguinal adenopathy Psychological: Alert and cooperative. Anxious.   Assessment and Recommendations:  1. Fe def anemia. Likely from chronic menstrual losses. She takes daily NSAIDs for knee pain. R/O colorectal neoplasms, AVMs, ulcer, gastritis, etc. She states she will be anxious on the day of her procedures and requests Xanax prior to coming for her procedures. The risks, benefits, and alternatives to colonoscopy with possible biopsy and possible polypectomy were discussed with the patient and they consent to proceed. The risks, benefits,  and alternatives to endoscopy with possible biopsy and possible dilation were discussed with the patient and they consent to proceed.   2. GERD. Nexium 40 mg po qam. Antireflux measures. EGD as above.   3. Morbid obesity. BMI=55. Weight loss strongly recommended.   4. PAF.   5. Anxiety.

## 2014-12-14 NOTE — Transfer of Care (Signed)
Immediate Anesthesia Transfer of Care Note  Patient: Samantha ApleyJacquelyn M Magnone  Procedure(s) Performed: Procedure(s): ESOPHAGOGASTRODUODENOSCOPY (EGD) WITH PROPOFOL (N/A) COLONOSCOPY WITH PROPOFOL (N/A)  Patient Location: PACU  Anesthesia Type:MAC  Level of Consciousness: sedated  Airway & Oxygen Therapy: Patient Spontanous Breathing and Patient connected to nasal cannula oxygen  Post-op Assessment: Report given to RN and Post -op Vital signs reviewed and stable  Post vital signs: Reviewed and stable  Last Vitals:  Filed Vitals:   12/14/14 1050  BP: 200/89  Pulse:   Temp:   Resp: 16    Complications: No apparent anesthesia complications

## 2014-12-14 NOTE — Op Note (Addendum)
[facility] [address] [place], [id number]   ENDOSCOPY PROCEDURE REPORT  PATIENT: Samantha Boone, Samantha Boone  MR#: [id number] BIRTHDATE: Jan 05, 1961 , 54  yrs. old GENDER: female ENDOSCOPIST: Meryl DareMalcolm T Ariel Dimitri, MD, [facility] REFERRED BY:  Crawford GivensGraham Duncan, Boone.D. PROCEDURE DATE:  12/14/2014 PROCEDURE:  EGD w/ biopsy ASA CLASS:     Class III INDICATIONS:  iron deficiency anemia.  GERD MEDICATIONS: Monitored anesthesia care and Residual sedation present  TOPICAL ANESTHETIC: DESCRIPTION OF PROCEDURE: After the risks benefits and alternatives of the procedure were thoroughly explained, informed consent was obtained.  The Pentax Gastroscope [id number] endoscope was introduced through the mouth and advanced to the second portion of the duodenum , Without limitations.  The instrument was slowly withdrawn as the mucosa was fully examined.    ESOPHAGUS: The mucosa of the esophagus appeared normal. STOMACH: The mucosa and folds of the stomach appeared normal. DUODENUM: The duodenal mucosa showed no abnormalities in the bulb and 2nd part of the duodenum.  Cold forceps biopsies were taken in the bulb and second portion.  Retroflexed views revealed a small hiatal hernia.  The scope was then withdrawn from the patient and the procedure completed.  COMPLICATIONS: There were no immediate complications.  ENDOSCOPIC IMPRESSION: 1.   Small hiatal hernia 2.   The EGD otherwise appeared normal  RECOMMENDATIONS: 1.  Anti-reflux regimen 2.  Await pathology results 3.  Continue PPI daily  eSigned:  Meryl DareMalcolm T Icker Swigert, MD, [facility] 12/14/2014 11:20 AM Revised: 12/14/2014 11:20 AM

## 2014-12-14 NOTE — Discharge Instructions (Signed)
Esophagogastroduodenoscopy  °Esophagogastroduodenoscopy (EGD) is a procedure to examine the lining of the esophagus, stomach, and first part of the small intestine (duodenum). A long, flexible, lighted tube with a camera attached (endoscope) is inserted down the throat to view these organs. This procedure is done to detect problems or abnormalities, such as inflammation, bleeding, ulcers, or growths, in order to treat them. The procedure lasts about 5-20 minutes. It is usually an outpatient procedure, but it may need to be performed in emergency cases in the hospital.  °LET YOUR CAREGIVER KNOW ABOUT:  °Allergies to food or medicine.  °All medicines you are taking, including vitamins, herbs, eyedrops, and over-the-counter medicines and creams.  °Use of steroids (by mouth or creams).  °Previous problems you or members of your family have had with the use of anesthetics.  °Any blood disorders you have.  °Previous surgeries you have had.  °Other health problems you have.  °Possibility of pregnancy, if this applies. °RISKS AND COMPLICATIONS  °Generally, EGD is a safe procedure. However, as with any procedure, complications can occur. Possible complications include:  °Infection.  °Bleeding.  °Tearing (perforation) of the esophagus, stomach, or duodenum.  °Difficulty breathing or not being able to breath.  °Excessive sweating.  °Spasms of the larynx.  °Slowed heartbeat.  °Low blood pressure. °BEFORE THE PROCEDURE  °Do not eat or drink anything for 6-8 hours before the procedure or as directed by your caregiver.  °Ask your caregiver about changing or stopping your regular medicines.  °If you wear dentures, be prepared to remove them before the procedure.  °Arrange for someone to drive you home after the procedure. °PROCEDURE  °A vein will be accessed to give medicines and fluids. A medicine to relax you (sedative) and a pain reliever will be given through that access into the vein.  °A numbing medicine (local anesthetic)  may be sprayed on your throat for comfort and to stop you from gagging or coughing.  °A mouth guard may be placed in your mouth to protect your teeth and to keep you from biting on the endoscope.  °You will be asked to lie on your left side.  °The endoscope is inserted down your throat and into the esophagus, stomach, and duodenum.  °Air is put through the endoscope to allow your caregiver to view the lining of your esophagus clearly.  °The esophagus, stomach, and duodenum is then examined. During the exam, your caregiver may:  °Remove tissue to be examined under a microscope (biopsy) for inflammation, infection, or other medical problems.  °Remove growths.  °Remove objects (foreign bodies) that are stuck.  °Treat any bleeding with medicines or other devices that stop tissues from bleeding (hot cautery, clipping devices).  °Widen (dilate) or stretch narrowed areas of the esophagus and stomach. °The endoscope will then be withdrawn. °AFTER THE PROCEDURE  °You will be taken to a recovery area to be monitored. You will be able to go home once you are stable and alert.  °Do not eat or drink anything until the local anesthetic and numbing medicines have worn off. You may choke.  °It is normal to feel bloated, have pain with swallowing, or have a sore throat for a short time. This will wear off.  °Your caregiver should be able to discuss his or her findings with you. It will take longer to discuss the test results if any biopsies were taken. °Document Released: 12/22/2004 Document Revised: 01/05/2014 Document Reviewed: 07/24/2012  °ExitCare® Patient Information ©2015 [employer]. This information is not   intended to replace advice given to you by your health care provider. Make sure you discuss any questions you have with your health care provider.  ° °Colonoscopy, Care After °Refer to this sheet in the next few weeks. These instructions provide you with information on caring for yourself after your procedure. Your  health care provider may also give you more specific instructions. Your treatment has been planned according to current medical practices, but problems sometimes occur. Call your health care provider if you have any problems or questions after your procedure. °WHAT TO EXPECT AFTER THE PROCEDURE  °After your procedure, it is typical to have the following: °· A small amount of blood in your stool. °· Moderate amounts of gas and mild abdominal cramping or bloating. °HOME CARE INSTRUCTIONS °· Do not drive, operate machinery, or sign important documents for 24 hours. °· You may shower and resume your regular physical activities, but move at a slower pace for the first 24 hours. °· Take frequent rest periods for the first 24 hours. °· Walk around or put a warm pack on your abdomen to help reduce abdominal cramping and bloating. °· Drink enough fluids to keep your urine clear or pale yellow. °· You may resume your normal diet as instructed by your health care provider. Avoid heavy or fried foods that are hard to digest. °· Avoid drinking alcohol for 24 hours or as instructed by your health care provider. °· Only take over-the-counter or prescription medicines as directed by your health care provider. °· If a tissue sample (biopsy) was taken during your procedure: °¨ Do not take aspirin or blood thinners for 7 days, or as instructed by your health care provider. °¨ Do not drink alcohol for 7 days, or as instructed by your health care provider. °¨ Eat soft foods for the first 24 hours. °SEEK MEDICAL CARE IF: °You have persistent spotting of blood in your stool 2-3 days after the procedure. °SEEK IMMEDIATE MEDICAL CARE IF: °· You have more than a small spotting of blood in your stool. °· You pass large blood clots in your stool. °· Your abdomen is swollen (distended). °· You have nausea or vomiting. °· You have a fever. °· You have increasing abdominal pain that is not relieved with medicine. °Document Released: 04/04/2004  Document Revised: 06/11/2013 Document Reviewed: 04/28/2013 °ExitCare® Patient Information ©2015 [employer]. This information is not intended to replace advice given to you by your health care provider. Make sure you discuss any questions you have with your health care provider. ° °

## 2014-12-14 NOTE — Op Note (Signed)
[facility] [address] [place], [id number]   COLONOSCOPY PROCEDURE REPORT  PATIENT: Samantha Boone, [name]  MR#: [id number] BIRTHDATE: Nov 23, 1960 , 54  yrs. old GENDER: female ENDOSCOPIST: Meryl DareMalcolm T Nowell Sites, MD, [facility] REFERRED XB:JYNWGNBY:Graham Para Marchuncan, M.D. PROCEDURE DATE:  12/14/2014 PROCEDURE:   Colonoscopy, diagnostic First Screening Colonoscopy - Avg.  risk and is 50 yrs.  old or older - No.  Prior Negative Screening - Now for repeat screening. N/A  History of Adenoma - Now for follow-up colonoscopy & has been > or = to 3 yrs.  N/A ASA CLASS:   Class III INDICATIONS:Unexplained iron deficiency anemia and Colorectal Neoplasm Risk Assessment for this procedure is average risk. MEDICATIONS: Monitored anesthesia care DESCRIPTION OF PROCEDURE:   After the risks benefits and alternatives of the procedure were thoroughly explained, informed consent was obtained.  The digital rectal exam revealed no abnormalities of the rectum.   The Pentax Ped Colon [id number] endoscope was introduced through the anus and advanced to the cecum, which was identified by both the appendix and ileocecal valve. No adverse events experienced.   The quality of the prep was good.  (MoviPrep was used)  The instrument was then slowly withdrawn as the colon was fully examined.    COLON FINDINGS: There was moderate diverticulosis noted in the sigmoid colon and descending colon with associated muscular hypertrophy. The examination was otherwise normal.  Retroflexed views revealed no abnormalities. The time to cecum = 2.5 Withdrawal time = 10.4   The scope was withdrawn and the procedure completed. COMPLICATIONS: There were no immediate complications.  ENDOSCOPIC IMPRESSION: 1.   Moderate diverticulosis in the sigmoid colon and descending colon 2.   The examination was otherwise normal  RECOMMENDATIONS: 1.  High fiber diet with liberal fluid intake. 2.  Continue current colorectal screening  recommendations for "routine risk" patients with a repeat colonoscopy in 10 years.  eSigned:  Meryl DareMalcolm T Shanai Lartigue, MD, [facility] 12/14/2014 11:14 AM

## 2014-12-14 NOTE — Anesthesia Postprocedure Evaluation (Signed)
Anesthesia Post Note  Patient: Samantha Boone  Procedure(s) Performed: Procedure(s) (LRB): ESOPHAGOGASTRODUODENOSCOPY (EGD) WITH PROPOFOL (N/A) COLONOSCOPY WITH PROPOFOL (N/A)  Anesthesia type: MAC  Patient location: PACU  Post pain: Pain level controlled  Post assessment: Post-op Vital signs reviewed  Last Vitals: BP 142/67 mmHg  Pulse 61  Temp(Src) 36.5 C (Oral)  Resp 12  Ht 5\' 4"  (1.626 m)  Wt 306 lb (138.801 kg)  BMI 52.50 kg/m2  SpO2 100%  Post vital signs: Reviewed  Level of consciousness: awake  Complications: No apparent anesthesia complications

## 2014-12-15 ENCOUNTER — Encounter (HOSPITAL_COMMUNITY): Payer: Self-pay | Admitting: Gastroenterology

## 2014-12-15 ENCOUNTER — Encounter: Payer: Self-pay | Admitting: Gastroenterology

## 2014-12-22 ENCOUNTER — Other Ambulatory Visit: Payer: Self-pay | Admitting: *Deleted

## 2014-12-22 MED ORDER — LOSARTAN POTASSIUM 100 MG PO TABS: 100.0000 mg | ORAL_TABLET | Freq: Every day | ORAL | Status: DC

## 2014-12-28 HISTORY — PX: OTHER SURGICAL HISTORY: SHX169

## 2015-01-25 ENCOUNTER — Ambulatory Visit: Payer: BC Managed Care – PPO | Admitting: Family Medicine

## 2015-01-27 ENCOUNTER — Ambulatory Visit (INDEPENDENT_AMBULATORY_CARE_PROVIDER_SITE_OTHER): Payer: BLUE CROSS/BLUE SHIELD | Admitting: Family Medicine

## 2015-01-27 ENCOUNTER — Encounter: Payer: Self-pay | Admitting: Family Medicine

## 2015-01-27 VITALS — BP 118/78 | HR 62 | Temp 98.6°F | Wt 309.5 lb

## 2015-01-27 DIAGNOSIS — L0293 Carbuncle, unspecified: Secondary | ICD-10-CM | POA: Diagnosis not present

## 2015-01-27 DIAGNOSIS — E559 Vitamin D deficiency, unspecified: Secondary | ICD-10-CM | POA: Diagnosis not present

## 2015-01-27 DIAGNOSIS — I4891 Unspecified atrial fibrillation: Secondary | ICD-10-CM | POA: Diagnosis not present

## 2015-01-27 MED ORDER — LORAZEPAM 1 MG PO TABS: ORAL_TABLET | ORAL | Status: DC

## 2015-01-27 MED ORDER — DOXYCYCLINE HYCLATE 100 MG PO CPEP: 100.0000 mg | ORAL_CAPSULE | Freq: Two times a day (BID) | ORAL | Status: DC | PRN

## 2015-01-27 NOTE — Progress Notes (Signed)
Pre visit review using our clinic review tool, if applicable. No additional management support is needed unless otherwise documented below in the visit note.  S/p colon and EGD 12/14/2014.  Noted likely OSA and has f/u home study pending.  We talked about weight and OSA, also with Afib.   Poor sleep affects her chances of having Afib episodes.  She has f/u with Dr. Tenny Crawoss pending for summer 2016.   Still taking BZD ~1 tab per week for anxiety.  No ADE on med.  Needs a refill.   Pap neg 12/07/14 with Dr. Arelia SneddonMcComb.  Noted in EMR.  D/w pt.    Breast and other skin cysts.  Tender, sore, usually not draining, some occ drain.  Had taken doxy episodically over the years, not recently.  One on either inferior breast, one in the groin and one prev drained on the L posterior knee area.    Meds, vitals, and allergies reviewed.   ROS: See HPI.  Otherwise, noncontributory.  GEN: nad, alert and oriented, obese HEENT: mucous membranes moist NECK: supple w/o LA CV: rrr.  PULM: ctab, no inc wob ABD: soft, +bs EXT: no edema SKIN: no acute rash but tender cysts noted on skin.

## 2015-01-27 NOTE — Patient Instructions (Signed)
Don't change your meds for now.  I'll await your sleep study results.  Take care.  Glad to see you.

## 2015-01-28 ENCOUNTER — Other Ambulatory Visit: Payer: Self-pay | Admitting: Family Medicine

## 2015-01-28 DIAGNOSIS — E559 Vitamin D deficiency, unspecified: Secondary | ICD-10-CM

## 2015-01-28 DIAGNOSIS — I10 Essential (primary) hypertension: Secondary | ICD-10-CM

## 2015-01-28 NOTE — Assessment & Plan Note (Signed)
Prev repleted, d/w pt.  Can change to 1000 units vit D per day.

## 2015-01-28 NOTE — Assessment & Plan Note (Signed)
Sounds to be RRR, she'll have f/u with cards.  D/w pt about AF and OSA relation. eval for OSA pending.  D/w pt about weight loss.  She uses prn BZD at times of high anxiety to try to control sx and not tip over into AF.  Okay to continue prn.  Refill done.  >25 minutes spent in face to face time with patient, >50% spent in counselling or coordination of care.

## 2015-01-28 NOTE — Assessment & Plan Note (Signed)
D/w pt about weight loss goals.

## 2015-01-28 NOTE — Assessment & Plan Note (Signed)
Restart doxy.  Continue routine skin care.

## 2015-02-04 ENCOUNTER — Encounter: Payer: Self-pay | Admitting: Family Medicine

## 2015-02-15 ENCOUNTER — Encounter: Payer: Self-pay | Admitting: Family Medicine

## 2015-02-15 ENCOUNTER — Other Ambulatory Visit: Payer: Self-pay | Admitting: *Deleted

## 2015-02-15 MED ORDER — NAPROXEN SODIUM 550 MG PO TABS: 550.0000 mg | ORAL_TABLET | Freq: Two times a day (BID) | ORAL | Status: DC

## 2015-02-15 NOTE — Telephone Encounter (Signed)
Sent. Thanks.   

## 2015-02-15 NOTE — Telephone Encounter (Signed)
MyChart refill request.  Last Filled:   ?  Please advise.

## 2015-03-04 ENCOUNTER — Ambulatory Visit (INDEPENDENT_AMBULATORY_CARE_PROVIDER_SITE_OTHER): Payer: BLUE CROSS/BLUE SHIELD | Admitting: Internal Medicine

## 2015-03-04 ENCOUNTER — Encounter: Payer: Self-pay | Admitting: Internal Medicine

## 2015-03-04 VITALS — BP 128/74 | HR 66 | Ht 64.0 in | Wt 308.0 lb

## 2015-03-04 DIAGNOSIS — I483 Typical atrial flutter: Secondary | ICD-10-CM

## 2015-03-04 DIAGNOSIS — I4891 Unspecified atrial fibrillation: Secondary | ICD-10-CM

## 2015-03-04 NOTE — Progress Notes (Signed)
Cardiology Office Note   Date:  03/04/2015   ID:  Samantha Boone, DOB May 09, 1961, MRN [id number]  PCP:  Crawford GivensGraham Duncan, MD  Cardiologist:   Dietrich PatesPaula Ketan Renz, MD   No chief complaint on file.  Pt presents for f/u of atrial fibrillation and HTN     History of Present Illness: Samantha Boone is a 54 y.o. female with a history of paroxysmal atrial fibrillation, HTN, and OSA   Since she was seen in clinic she had a spell of tachycardia in October 2015.  SHe Took flecanide x 2 and metoprolol  Seen in clinic  EKG showed atrial flutter  She eventually converted on own 12 hours later  Toprol was increased to 75 bid   [place] she had 1 more spell  She says that she has been really stressed  Went into afib on June 19 at 3: AM  Woke up feeling bad. She thinks around 10 am she may have gone into atirla flutter although there is no documentation.    Felt diffetent  Bad, but not as bad as earlier in night   Took 150 flecanide x 2 150   and toprol  400 mg     12.5 hours   When not in afib she feels OK  Sleep study done at home  Showed severe sleep apnea.  Note that HR monitor documented high rates of 188  Unfortunatedly no rhythm data.  Pt tried CPAP (fasce and nasal and could not tolerate.       Current Outpatient Prescriptions  Medication Sig Dispense Refill  . acetaminophen (TYLENOL) 500 MG tablet Take 1,000 mg by mouth every 4 (four) hours as needed for mild pain or headache.     Marland Kitchen. aspirin 325 MG tablet Take 325 mg by mouth every morning.     . Calcium Carbonate (CALTRATE 600 PO) Take 1 tablet by mouth every morning.     . chlorpheniramine (CHLOR-TRIMETON) 4 MG tablet Take 4 mg by mouth daily as needed for allergies.    . cholecalciferol (VITAMIN D) 1000 UNITS tablet Take 1,000 Units by mouth daily.    . clindamycin (CLEOCIN T) 1 % lotion Apply 1 application topically 2 (two) times daily as needed (cyst.).     Marland Kitchen. doxycycline (DORYX) 100 MG DR capsule Take 1 capsule (100 mg total) by mouth  2 (two) times daily as needed (cyst.). 20 capsule 0  . esomeprazole (NEXIUM) 40 MG capsule Take 1 capsule (40 mg total) by mouth every morning. 90 capsule 3  . ezetimibe (ZETIA) 10 MG tablet Take 5 mg by mouth at bedtime.     . flecainide (TAMBOCOR) 100 MG tablet Take 1 & 1/2 tablets by mouth daily as needed for a-fib 45 tablet 2  . fluticasone (FLONASE) 50 MCG/ACT nasal spray Place 2 sprays into both nostrils daily. 48 g 3  . furosemide (LASIX) 20 MG tablet Take 1 tablet (20 mg total) by mouth every morning. 90 tablet 3  . glucosamine-chondroitin 500-400 MG tablet Take 1 tablet by mouth 2 (two) times daily.     Marland Kitchen. LORazepam (ATIVAN) 1 MG tablet Take 1/2 to 1 tablet by mouth daily as needed for anxiety related to a-fib 30 tablet 1  . losartan (COZAAR) 100 MG tablet Take 1 tablet (100 mg total) by mouth daily. 90 tablet 3  . metoprolol succinate (TOPROL-XL) 50 MG 24 hr tablet TAKE 1 AND 1/2 TABLETS BY MOUTH AT BEDTIME 135 tablet 3  . metoprolol tartrate (LOPRESSOR)  25 MG tablet Take one tablet by mouth twice daily as needed for a-fib breakthrough 60 tablet 2  . mineral/vitamin supplement (MULTIGEN) 70 MG TABS tablet Take 1 tablet (70 mg total) by mouth daily. 180 tablet 3  . mupirocin cream (BACTROBAN) 2 % Apply topically 2 (two) times daily. (Patient taking differently: Apply 1 application topically 2 (two) times daily as needed (rash.). ) 30 g 1  . naproxen sodium (ANAPROX) 550 MG tablet Take 1 tablet (550 mg total) by mouth 2 (two) times daily with a meal. 180 tablet 1  . potassium chloride (K-DUR) 10 MEQ tablet Take 1 tablet (10 mEq total) by mouth daily. 90 tablet 3  . Red Yeast Rice 600 MG CAPS Take by mouth 2 (two) times daily.      Marland Kitchen Soap & Cleansers (CETAPHIL GENTLE CLEANSER EX) Apply 1 application topically daily as needed (face wash.).      No current facility-administered medications for this visit.    Allergies:   Eliquis   Past Medical History  Diagnosis Date  . Allergy   .  Anemia   . GERD (gastroesophageal reflux disease)   . Headache(784.0)   . Hyperlipidemia   . Hypertension   . Hx: UTI (urinary tract infection)     06-02-14 none recent  . Atrial fibrillation     one time episode  . Anxiety   . Arthritis     knees  . Goiter     multinodular, per Dr. Robet Leu with neg bx    Past Surgical History  Procedure Laterality Date  . Fibroid removed    . Uterine ablation  07/25/2007  . Esophagogastroduodenoscopy (egd) with propofol N/A 12/14/2014    Procedure: ESOPHAGOGASTRODUODENOSCOPY (EGD) WITH PROPOFOL;  Surgeon: Meryl Dare, MD;  Location: [facility];  Service: Endoscopy;  Laterality: N/A;  . Colonoscopy with propofol N/A 12/14/2014    Procedure: COLONOSCOPY WITH PROPOFOL;  Surgeon: Meryl Dare, MD;  Location: [facility];  Service: Endoscopy;  Laterality: N/A;  . Bone density scan  12/28/2014    Normal     Social History:  The patient  reports that she has never smoked. She has never used smokeless tobacco. She reports that she drinks alcohol. She reports that she does not use illicit drugs.   Family History:  The patient's family history includes Arthritis in her mother; Atrial fibrillation in her father; Colon polyps in her mother; Hypertension in her father; Other in her mother; Prostate cancer in her father.    ROS:  Please see the history of present illness. All other systems are reviewed and  Negative to the above problem except as noted.    PHYSICAL EXAM: VS:  BP 128/74 mmHg  Pulse 66  Ht  (1.626 m)  Wt 308 lb (139.708 kg)  BMI 52.84 kg/m2  GEN: Morbidly obese 54 yo , in no acute distress HEENT: normal Neck: no JVD, carotid bruits, or masses Cardiac: RRR; no murmurs, rubs, or gallops,no edema  Respiratory:  clear to auscultation bilaterally, normal work of breathing GI: soft, nontender, nondistended, + BS  No hepatomegaly  MS: no deformity Moving all extremities   Skin: warm and dry, no rash Neuro:  Strength and  sensation are intact Psych: euthymic mood, full affect   EKG:  EKG is ordered today.   Lipid Panel    Component Value Date/Time   CHOL 176 12/30/2013 0909   TRIG 190.0* 12/30/2013 0909   HDL 41.90 12/30/2013 0909   CHOLHDL  4 12/30/2013 0909   VLDL 38.0 12/30/2013 0909   LDLCALC 96 12/30/2013 0909      Wt Readings from Last 3 Encounters:  03/04/15 308 lb (139.708 kg)  01/27/15 309 lb 8 oz (140.388 kg)  12/14/14 306 lb (138.801 kg)      ASSESSMENT AND PLAN:  1.  PAF  Pt has had 7 episodes total  She says she senses them  All around times of increased mental stress.  She is CHADS VASc score of 2  Should be on anticoag  She declines  Have reviewed stroke risk in past Will get a holter monitor to follow night time rhythms  2.  HTN  Adequate control    3.  Hx anemia  Hgb normal  Recent GI eval (upper and lower endo) normal  4.  OSA  WIll review with T Turner  Question if she can use mouth guard  5.  Morbid obesity  Needs to lose wt.      Current medicines are reviewed at length with the patient today.  The patient does not have concerns regarding medicines.  The following changes have been made:   Labs/ tests ordered today include: No orders of the defined types were placed in this encounter.     Disposition:   FU with  in   Signed, Dietrich Pates, MD  03/04/2015 4:20 PM    [facility] [address] [place], [place], [place]  [address] Phone: [phone]; Fax: [phone]

## 2015-03-04 NOTE — Patient Instructions (Addendum)
Medication Instructions:  Your physician recommends that you continue on your current medications as directed. Please refer to the Current Medication list given to you today.   Labwork: NONE  Testing/Procedures: Your physician has recommended that you wear a holter monitor. Holter monitors are medical devices that record the heart's electrical activity. Doctors most often use these monitors to diagnose arrhythmias. Arrhythmias are problems with the speed or rhythm of the heartbeat. The monitor is a small, portable device. You can wear one while you do your normal daily activities. This is usually used to diagnose what is causing palpitations/syncope (passing out).   Follow-Up: Your physician wants you to follow-up in: 12 months with Dr. Tenny Crawoss. You will receive a reminder letter in the mail two months in advance. If you don't receive a letter, please call our office to schedule the follow-up appointment.   Any Other Special Instructions Will Be Listed Below (If Applicable).

## 2015-03-11 ENCOUNTER — Ambulatory Visit (INDEPENDENT_AMBULATORY_CARE_PROVIDER_SITE_OTHER): Payer: BLUE CROSS/BLUE SHIELD

## 2015-03-11 DIAGNOSIS — I4891 Unspecified atrial fibrillation: Secondary | ICD-10-CM

## 2015-03-11 DIAGNOSIS — I483 Typical atrial flutter: Secondary | ICD-10-CM

## 2015-04-16 ENCOUNTER — Encounter: Payer: Self-pay | Admitting: Internal Medicine

## 2015-06-15 ENCOUNTER — Encounter: Payer: Self-pay | Admitting: Family Medicine

## 2015-06-15 ENCOUNTER — Other Ambulatory Visit: Payer: Self-pay | Admitting: Orthopedic Surgery

## 2015-06-15 DIAGNOSIS — M545 Low back pain, unspecified: Secondary | ICD-10-CM

## 2015-06-16 NOTE — Telephone Encounter (Signed)
Last lipid check was 12/30/13, future order in for lab work. Is it okay to refill?

## 2015-06-17 ENCOUNTER — Other Ambulatory Visit: Payer: Self-pay | Admitting: Family Medicine

## 2015-06-17 MED ORDER — EZETIMIBE 10 MG PO TABS: 5.0000 mg | ORAL_TABLET | Freq: Every day | ORAL | Status: DC

## 2015-06-28 ENCOUNTER — Ambulatory Visit (INDEPENDENT_AMBULATORY_CARE_PROVIDER_SITE_OTHER): Payer: BLUE CROSS/BLUE SHIELD | Admitting: Podiatry

## 2015-06-28 ENCOUNTER — Encounter: Payer: Self-pay | Admitting: Podiatry

## 2015-06-28 VITALS — BP 127/76 | HR 69 | Resp 16

## 2015-06-28 DIAGNOSIS — L600 Ingrowing nail: Secondary | ICD-10-CM

## 2015-06-28 NOTE — Patient Instructions (Signed)

## 2015-06-30 ENCOUNTER — Telehealth: Payer: Self-pay | Admitting: *Deleted

## 2015-06-30 NOTE — Progress Notes (Signed)
Subjective:     Patient ID: Samantha Boone, female   DOB: 09-Mar-1961, 54 y.o.   MRN: [id number]  HPI patient states I have a very painful ingrown toenail on my left big toe and I've tried to trim and soak without relief of symptoms. Been present for at least several weeks   Review of Systems     Objective:   Physical Exam Neurovascular status intact muscle strength adequate range of motion within normal limits with patient noted to have incurvated hallux nail border left hallux lateral that's painful when pressed with distal redness but no active drainage noted. Patient does have elongation of remaining nails    Assessment:     Ingrown toenail deformity left hallux lateral border that's painful and nail disease bilateral    Plan:     H&P and condition reviewed with patient. I've recommended removal of the border and I explained procedure and risk and patient wants surgery. Today I infiltrated 60 mg Xylocaine Marcaine mixture remove the lateral border exposed matrix and applied phenol 3 applications 30 seconds followed by alcohol lavage and sterile dressing. Gave instructions on soaks and reappoint

## 2015-06-30 NOTE — Telephone Encounter (Signed)
Called patient at [phone] (Cell #) to check to see how they were doing from their ingrown toenail procedure that was performed  On Monday, June 28, 2015. Pt stated, "Toe doing okay, but has had some bleeding on band-aid". Will soak toe Monday night. Toe does not hurt. Pt took some Tylenol with some relief.

## 2015-07-01 ENCOUNTER — Ambulatory Visit
Admission: RE | Admit: 2015-07-01 | Discharge: 2015-07-01 | Disposition: A | Discharge status: Home / Self Care | Payer: BLUE CROSS/BLUE SHIELD | Source: Ambulatory Visit | Attending: Orthopedic Surgery | Admitting: Orthopedic Surgery

## 2015-07-01 DIAGNOSIS — M545 Low back pain, unspecified: Secondary | ICD-10-CM

## 2015-07-14 ENCOUNTER — Other Ambulatory Visit: Payer: Self-pay

## 2015-07-14 MED ORDER — METOPROLOL SUCCINATE ER 50 MG PO TB24: ORAL_TABLET | ORAL | Status: DC

## 2015-07-16 ENCOUNTER — Other Ambulatory Visit: Payer: Self-pay | Admitting: *Deleted

## 2015-07-16 MED ORDER — METOPROLOL SUCCINATE ER 50 MG PO TB24: ORAL_TABLET | ORAL | Status: DC

## 2015-07-19 ENCOUNTER — Other Ambulatory Visit: Payer: Self-pay | Admitting: *Deleted

## 2015-07-19 ENCOUNTER — Encounter: Payer: Self-pay | Admitting: Internal Medicine

## 2015-07-19 MED ORDER — METOPROLOL SUCCINATE ER 50 MG PO TB24: ORAL_TABLET | ORAL | Status: DC

## 2015-07-27 ENCOUNTER — Other Ambulatory Visit (INDEPENDENT_AMBULATORY_CARE_PROVIDER_SITE_OTHER): Payer: BLUE CROSS/BLUE SHIELD

## 2015-07-27 ENCOUNTER — Other Ambulatory Visit: Payer: Self-pay | Admitting: Family Medicine

## 2015-07-27 DIAGNOSIS — E559 Vitamin D deficiency, unspecified: Secondary | ICD-10-CM | POA: Diagnosis not present

## 2015-07-27 DIAGNOSIS — Z1159 Encounter for screening for other viral diseases: Secondary | ICD-10-CM

## 2015-07-27 DIAGNOSIS — I10 Essential (primary) hypertension: Secondary | ICD-10-CM | POA: Diagnosis not present

## 2015-07-27 LAB — COMPREHENSIVE METABOLIC PANEL
ALBUMIN: 3.9 g/dL (ref 3.5–5.2)
ALT: 16 U/L (ref 0–35)
AST: 15 U/L (ref 0–37)
Alkaline Phosphatase: 79 U/L (ref 39–117)
BUN: 23 mg/dL (ref 6–23)
CALCIUM: 9.5 mg/dL (ref 8.4–10.5)
CHLORIDE: 101 meq/L (ref 96–112)
CO2: 29 mEq/L (ref 19–32)
CREATININE: 0.82 mg/dL (ref 0.40–1.20)
GFR: 76.47 mL/min (ref >60.00)
Glucose, Bld: 123 mg/dL — ABNORMAL HIGH (ref 70–99)
Potassium: 4.4 mEq/L (ref 3.5–5.1)
Sodium: 138 mEq/L (ref 135–145)
Total Bilirubin: 0.4 mg/dL (ref 0.2–1.2)
Total Protein: 7.2 g/dL (ref 6.0–8.3)

## 2015-07-27 LAB — LIPID PANEL
CHOL/HDL RATIO: 4
CHOLESTEROL: 203 mg/dL — AB (ref 0–200)
HDL: 45.2 mg/dL (ref >39.00)
LDL Cholesterol: 119 mg/dL — ABNORMAL HIGH (ref 0–99)
NONHDL: 158.15
Triglycerides: 195 mg/dL — ABNORMAL HIGH (ref 0.0–149.0)
VLDL: 39 mg/dL (ref 0.0–40.0)

## 2015-07-27 LAB — VITAMIN D 25 HYDROXY (VIT D DEFICIENCY, FRACTURES): VITD: 34.71 ng/mL (ref 30.00–100.00)

## 2015-07-28 LAB — HEPATITIS C ANTIBODY: HCV Ab: NEGATIVE

## 2015-08-03 ENCOUNTER — Ambulatory Visit (INDEPENDENT_AMBULATORY_CARE_PROVIDER_SITE_OTHER): Payer: BLUE CROSS/BLUE SHIELD | Admitting: Family Medicine

## 2015-08-03 ENCOUNTER — Encounter: Payer: Self-pay | Admitting: Family Medicine

## 2015-08-03 VITALS — BP 126/88 | HR 65 | Temp 98.4°F | Ht 64.0 in | Wt 318.2 lb

## 2015-08-03 DIAGNOSIS — I4891 Unspecified atrial fibrillation: Secondary | ICD-10-CM

## 2015-08-03 DIAGNOSIS — F418 Other specified anxiety disorders: Secondary | ICD-10-CM

## 2015-08-03 DIAGNOSIS — Z7189 Other specified counseling: Secondary | ICD-10-CM

## 2015-08-03 DIAGNOSIS — Z0000 Encounter for general adult medical examination without abnormal findings: Secondary | ICD-10-CM | POA: Diagnosis not present

## 2015-08-03 MED ORDER — LORAZEPAM 1 MG PO TABS: ORAL_TABLET | ORAL | Status: AC

## 2015-08-03 NOTE — Progress Notes (Signed)
Pre visit review using our clinic review tool, if applicable. No additional management support is needed unless otherwise documented below in the visit note.  CPE- See plan.  Routine anticipatory guidance given to patient.  See health maintenance.  Tetanus 2006 Flu 2016 PNA not due Shingles not due Pap 2016 Mammogram deferred for now.  Colonoscopy 2016 DXA 2016 Living will d/w pt.  Husband designated if patient were incapacitated.  Diet and exercise d/w pt.  Weight up with lack of mobility.   HCV neg.  D/w pt.    She had recent A fib flare after prednisone use.  Resolved now, treated medically.  Anxiety- had to use some of her BZD recently, while on prednisone.  No ADE on BZD.  Needs refill.    She fell on vacation recently.  Still with pain and has been seen by ortho clinic.  Still out of work due to R foot numbness.  She had prev spinal injection, has f/u set of injections pending.  Gained weight.  Walking with a cane.   MRI prev done by ortho.    PMH and SH reviewed  Meds, vitals, and allergies reviewed.   ROS: See HPI.  Otherwise negative.    GEN: nad, alert and oriented. obese HEENT: mucous membranes moist NECK: supple w/o LA CV: rrr. PULM: ctab, no inc wob ABD: soft, +bs EXT: no edema

## 2015-08-03 NOTE — Patient Instructions (Signed)
Get a tetanus shot next year.   Take care.  Don't change your meds for now.  Cut back on sweets in the meantime.

## 2015-08-04 DIAGNOSIS — F418 Other specified anxiety disorders: Secondary | ICD-10-CM | POA: Insufficient documentation

## 2015-08-04 DIAGNOSIS — Z0000 Encounter for general adult medical examination without abnormal findings: Secondary | ICD-10-CM | POA: Insufficient documentation

## 2015-08-04 DIAGNOSIS — Z7189 Other specified counseling: Secondary | ICD-10-CM | POA: Insufficient documentation

## 2015-08-04 NOTE — Assessment & Plan Note (Signed)
Tetanus 2006  Flu 2016  PNA not due  Shingles not due  Pap 2016  Mammogram deferred for now.  Colonoscopy 2016  DXA 2016  Living will d/w pt. Husband designated if patient were incapacitated.  Diet and exercise d/w pt. Weight up with lack of mobility.  HCV neg. D/w pt.  D/w pt about low carb diet and then return to exercise when possible.

## 2015-08-04 NOTE — Assessment & Plan Note (Signed)
Continue prn BZD.  No ADE on med.  She agrees.

## 2015-08-12 ENCOUNTER — Encounter: Payer: Self-pay | Admitting: Family Medicine

## 2015-08-12 ENCOUNTER — Other Ambulatory Visit: Payer: Self-pay | Admitting: *Deleted

## 2015-08-12 MED ORDER — ESOMEPRAZOLE MAGNESIUM 40 MG PO CPDR: 40.0000 mg | DELAYED_RELEASE_CAPSULE | ORAL | Status: DC

## 2015-09-08 ENCOUNTER — Encounter: Payer: Self-pay | Admitting: Family Medicine

## 2015-09-09 ENCOUNTER — Other Ambulatory Visit: Payer: Self-pay | Admitting: *Deleted

## 2015-09-09 MED ORDER — FLUTICASONE PROPIONATE 50 MCG/ACT NA SUSP
2.0000 | Freq: Every day | NASAL | Status: DC
Start: 2015-09-09 — End: 2015-10-01

## 2015-09-26 ENCOUNTER — Encounter: Payer: Self-pay | Admitting: Family Medicine

## 2015-09-27 ENCOUNTER — Other Ambulatory Visit: Payer: Self-pay | Admitting: *Deleted

## 2015-09-27 MED ORDER — FUROSEMIDE 20 MG PO TABS: 20.0000 mg | ORAL_TABLET | ORAL | Status: DC

## 2015-09-27 MED ORDER — POTASSIUM CHLORIDE ER 10 MEQ PO TBCR: 10.0000 meq | EXTENDED_RELEASE_TABLET | Freq: Every day | ORAL | Status: DC

## 2015-10-01 ENCOUNTER — Encounter: Payer: Self-pay | Admitting: Family Medicine

## 2015-10-01 ENCOUNTER — Other Ambulatory Visit: Payer: Self-pay | Admitting: *Deleted

## 2015-10-01 MED ORDER — FLUTICASONE PROPIONATE 50 MCG/ACT NA SUSP: 2.0000 | Freq: Every day | NASAL | Status: DC

## 2015-10-08 ENCOUNTER — Other Ambulatory Visit: Payer: Self-pay | Admitting: *Deleted

## 2015-10-08 MED ORDER — POTASSIUM CHLORIDE ER 10 MEQ PO TBCR: 10.0000 meq | EXTENDED_RELEASE_TABLET | Freq: Every day | ORAL | Status: DC

## 2015-10-08 MED ORDER — FUROSEMIDE 20 MG PO TABS: 20.0000 mg | ORAL_TABLET | ORAL | Status: DC

## 2015-10-11 ENCOUNTER — Other Ambulatory Visit: Payer: Self-pay | Admitting: Family Medicine

## 2015-10-25 ENCOUNTER — Telehealth: Payer: Self-pay | Admitting: Family Medicine

## 2015-10-25 MED ORDER — NAPROXEN SODIUM 550 MG PO TABS: 550.0000 mg | ORAL_TABLET | Freq: Two times a day (BID) | ORAL | Status: DC

## 2015-10-25 MED ORDER — FLUTICASONE PROPIONATE 50 MCG/ACT NA SUSP: 2.0000 | Freq: Every day | NASAL | Status: DC

## 2015-10-25 NOTE — Telephone Encounter (Signed)
Pt has been trying to get a refill for 3-4 weeks of her Flonase.  Pt has a fax number we could try for her mail order pharmacy.  They told her they have been having trouble with their electronic portal. [phone] fax for pharmacy.  Pt also needs a refill on her Naproxen Sodium.  She needs a new script for this because she has no refills left on this.    She needs both written as 1 month prescriptions with 3 refills.

## 2015-10-25 NOTE — Telephone Encounter (Signed)
Please fax in.  Thanks.  Both printed- shouldn't they be for 3 months with 3rf?

## 2015-10-26 NOTE — Telephone Encounter (Signed)
Rx's faxed to number provided below.

## 2015-10-27 ENCOUNTER — Other Ambulatory Visit: Payer: Self-pay

## 2015-10-27 DIAGNOSIS — Z1231 Encounter for screening mammogram for malignant neoplasm of breast: Secondary | ICD-10-CM

## 2015-11-16 ENCOUNTER — Ambulatory Visit: Payer: BLUE CROSS/BLUE SHIELD

## 2015-11-16 ENCOUNTER — Other Ambulatory Visit: Payer: Self-pay | Admitting: Family Medicine

## 2015-11-16 MED ORDER — CLINDAMYCIN PHOSPHATE 1 % EX LOTN: 1.0000 "application " | TOPICAL_LOTION | Freq: Two times a day (BID) | CUTANEOUS | Status: DC | PRN

## 2015-11-16 NOTE — Progress Notes (Signed)
Refill requested by patient, sent.

## 2015-11-23 ENCOUNTER — Ambulatory Visit
Admission: RE | Admit: 2015-11-23 | Discharge: 2015-11-23 | Disposition: A | Discharge status: Home / Self Care | Payer: BLUE CROSS/BLUE SHIELD | Source: Ambulatory Visit

## 2015-11-23 DIAGNOSIS — Z1231 Encounter for screening mammogram for malignant neoplasm of breast: Secondary | ICD-10-CM

## 2015-11-24 ENCOUNTER — Other Ambulatory Visit: Payer: Self-pay | Admitting: Family Medicine

## 2015-11-24 DIAGNOSIS — R928 Other abnormal and inconclusive findings on diagnostic imaging of breast: Secondary | ICD-10-CM

## 2015-11-30 ENCOUNTER — Ambulatory Visit
Admission: RE | Admit: 2015-11-30 | Discharge: 2015-11-30 | Disposition: A | Discharge status: Home / Self Care | Payer: BLUE CROSS/BLUE SHIELD | Source: Ambulatory Visit | Attending: Family Medicine | Admitting: Family Medicine

## 2015-11-30 DIAGNOSIS — R928 Other abnormal and inconclusive findings on diagnostic imaging of breast: Secondary | ICD-10-CM

## 2015-12-14 ENCOUNTER — Other Ambulatory Visit: Payer: Self-pay | Admitting: Endocrinology

## 2015-12-14 DIAGNOSIS — R5381 Other malaise: Secondary | ICD-10-CM

## 2015-12-14 DIAGNOSIS — E041 Nontoxic single thyroid nodule: Secondary | ICD-10-CM

## 2015-12-26 ENCOUNTER — Encounter: Payer: Self-pay | Admitting: Family Medicine

## 2015-12-27 ENCOUNTER — Other Ambulatory Visit: Payer: Self-pay | Admitting: *Deleted

## 2015-12-27 MED ORDER — MULTIGEN 70 MG PO TABS: 1.0000 | ORAL_TABLET | Freq: Every day | ORAL | Status: DC

## 2015-12-27 MED ORDER — LOSARTAN POTASSIUM 100 MG PO TABS: 100.0000 mg | ORAL_TABLET | Freq: Every day | ORAL | Status: DC

## 2016-01-13 ENCOUNTER — Ambulatory Visit
Admission: RE | Admit: 2016-01-13 | Discharge: 2016-01-13 | Disposition: A | Discharge status: Home / Self Care | Payer: No Typology Code available for payment source | Source: Ambulatory Visit | Attending: Endocrinology | Admitting: Endocrinology

## 2016-01-13 DIAGNOSIS — E041 Nontoxic single thyroid nodule: Secondary | ICD-10-CM

## 2016-01-18 ENCOUNTER — Encounter: Payer: Self-pay | Admitting: Family Medicine

## 2016-01-18 ENCOUNTER — Ambulatory Visit (INDEPENDENT_AMBULATORY_CARE_PROVIDER_SITE_OTHER): Payer: BLUE CROSS/BLUE SHIELD | Admitting: Family Medicine

## 2016-01-18 VITALS — BP 130/78 | HR 72 | Temp 97.8°F | Wt 317.0 lb

## 2016-01-18 DIAGNOSIS — I4891 Unspecified atrial fibrillation: Secondary | ICD-10-CM | POA: Diagnosis not present

## 2016-01-18 DIAGNOSIS — Z862 Personal history of diseases of the blood and blood-forming organs and certain disorders involving the immune mechanism: Secondary | ICD-10-CM

## 2016-01-18 DIAGNOSIS — D649 Anemia, unspecified: Secondary | ICD-10-CM

## 2016-01-18 DIAGNOSIS — E041 Nontoxic single thyroid nodule: Secondary | ICD-10-CM

## 2016-01-18 DIAGNOSIS — E039 Hypothyroidism, unspecified: Secondary | ICD-10-CM

## 2016-01-18 DIAGNOSIS — E049 Nontoxic goiter, unspecified: Secondary | ICD-10-CM

## 2016-01-18 LAB — IBC PANEL
Iron: 58 ug/dL (ref 42–145)
Saturation Ratios: 12.4 % — ABNORMAL LOW (ref 20.0–50.0)
Transferrin: 333 mg/dL (ref 212.0–360.0)

## 2016-01-18 LAB — CBC WITH DIFFERENTIAL/PLATELET
BASOS PCT: 0.5 % (ref 0.0–3.0)
Basophils Absolute: 0 10*3/uL (ref 0.0–0.1)
EOS PCT: 4.1 % (ref 0.0–5.0)
Eosinophils Absolute: 0.3 10*3/uL (ref 0.0–0.7)
HEMATOCRIT: 37 % (ref 36.0–46.0)
HEMOGLOBIN: 12.3 g/dL (ref 12.0–15.0)
LYMPHS PCT: 26 % (ref 12.0–46.0)
Lymphs Abs: 2.2 10*3/uL (ref 0.7–4.0)
MCHC: 33.3 g/dL (ref 30.0–36.0)
MCV: 82.8 fl (ref 78.0–100.0)
Monocytes Absolute: 0.6 10*3/uL (ref 0.1–1.0)
Monocytes Relative: 7.3 % (ref 3.0–12.0)
NEUTROS ABS: 5.2 10*3/uL (ref 1.4–7.7)
Neutrophils Relative %: 62.1 % (ref 43.0–77.0)
PLATELETS: 379 10*3/uL (ref 150.0–400.0)
RBC: 4.47 Mil/uL (ref 3.87–5.11)
RDW: 14.8 % (ref 11.5–15.5)
WBC: 8.4 10*3/uL (ref 4.0–10.5)

## 2016-01-18 LAB — TSH: TSH: 1.08 u[IU]/mL (ref 0.35–4.50)

## 2016-01-18 MED ORDER — METOPROLOL SUCCINATE ER 50 MG PO TB24: ORAL_TABLET | ORAL | Status: AC

## 2016-01-18 MED ORDER — FLUTICASONE PROPIONATE 50 MCG/ACT NA SUSP: 2.0000 | Freq: Every day | NASAL | Status: AC

## 2016-01-18 MED ORDER — ESOMEPRAZOLE MAGNESIUM 40 MG PO CPDR: 40.0000 mg | DELAYED_RELEASE_CAPSULE | ORAL | Status: AC

## 2016-01-18 MED ORDER — METOPROLOL TARTRATE 25 MG PO TABS: ORAL_TABLET | ORAL | Status: AC

## 2016-01-18 MED ORDER — EZETIMIBE 10 MG PO TABS: 5.0000 mg | ORAL_TABLET | Freq: Every day | ORAL | Status: AC

## 2016-01-18 MED ORDER — FUROSEMIDE 20 MG PO TABS: 20.0000 mg | ORAL_TABLET | ORAL | Status: AC

## 2016-01-18 MED ORDER — FLECAINIDE ACETATE 100 MG PO TABS: ORAL_TABLET | ORAL | Status: AC

## 2016-01-18 MED ORDER — LOSARTAN POTASSIUM 100 MG PO TABS: 100.0000 mg | ORAL_TABLET | Freq: Every day | ORAL | Status: AC

## 2016-01-18 MED ORDER — DOXYCYCLINE HYCLATE 100 MG PO TABS: 100.0000 mg | ORAL_TABLET | Freq: Two times a day (BID) | ORAL | Status: AC

## 2016-01-18 MED ORDER — NAPROXEN SODIUM 550 MG PO TABS: 550.0000 mg | ORAL_TABLET | Freq: Two times a day (BID) | ORAL | Status: AC

## 2016-01-18 MED ORDER — CLINDAMYCIN PHOSPHATE 1 % EX LOTN: 1.0000 "application " | TOPICAL_LOTION | Freq: Two times a day (BID) | CUTANEOUS | Status: AC | PRN

## 2016-01-18 MED ORDER — POTASSIUM CHLORIDE ER 10 MEQ PO TBCR
10.0000 meq | EXTENDED_RELEASE_TABLET | Freq: Every day | ORAL | Status: AC
Start: 2016-01-18 — End: ?

## 2016-01-18 NOTE — Progress Notes (Signed)
Pre visit review using our clinic review tool, if applicable. No additional management support is needed unless otherwise documented below in the visit note.  She is moving to [place] soon.  She'll check on record release when she gets to her new location.  She'll have access to pools and exercise facilities.  This is a big/good change for the patient.  She is checking on new docs in the area.    H/o AF.  She'll need refill on prn tambocor and metoprolol tartrate, hasn't used either in months.  No AF recently.  No ADE on meds.    Back pain and leg pain: Her ROM is better in her R foot.  She has f/u injection tomorrow.  Still with R foot numbness but overall improved from prev baseline.    She had u/s thyroid done, d/w pt.  Needs TSH done.  See notes on labs.    H/o anemia when she had heavy periods.  On multigen iron, taken weekly.  Due for f/u CBC.  She may be able to stop iron depending on her levels.  See notes on labs.   Meds, vitals, and allergies reviewed.   ROS: Per HPI unless specifically indicated in ROS section   GEN: nad, alert and oriented, obese, walking with cane.  HEENT: mucous membranes moist NECK: supple w/o LA CV: rrr PULM: ctab, no inc wob ABD: soft, +bs EXT: no edema

## 2016-01-18 NOTE — Patient Instructions (Signed)
Go to the lab on the way out.  We'll contact you with your lab report. Take care.  Glad to see you.  Good luck with the move.

## 2016-01-19 ENCOUNTER — Encounter: Payer: Self-pay | Admitting: Family Medicine

## 2016-01-19 NOTE — Assessment & Plan Note (Signed)
U/s d/w pt, TSH pending.  See notes on labs.  >25 minutes spent in face to face time with patient, >50% spent in counselling or coordination of care.

## 2016-01-19 NOTE — Assessment & Plan Note (Signed)
See notes on labs.  Stop iron.

## 2016-01-19 NOTE — Assessment & Plan Note (Signed)
Continue prn abortive tx on top of proph meds.  No ADE.  rxs done for patient and she'll est with new MD in [place].   I'll miss seeing this patient and her husband when the move.  Both are pleasant kind and reliable people.

## 2016-02-08 ENCOUNTER — Ambulatory Visit: Payer: BLUE CROSS/BLUE SHIELD | Admitting: Family Medicine

## 2016-02-17 ENCOUNTER — Other Ambulatory Visit: Payer: No Typology Code available for payment source

## 2016-05-21 IMAGING — US US SOFT TISSUE HEAD/NECK
1 series · 13 of 25 positions shown · non-contrast
Comparison: Thyroid ultrasound - 09/26/2012 02/17/2009;
ultrasound-guided bilateral thyroid nodule biopsy - 02/23/2009

CLINICAL DATA: Evaluate thyroid nodules

EXAM:
THYROID ULTRASOUND
TECHNIQUE: Ultrasound examination of the thyroid gland and adjacent soft
tissues was performed.

[Series 1: us soft tissue head/neck · 0.11mm/px · 13 of 75 slices shown]
[im 1/75]
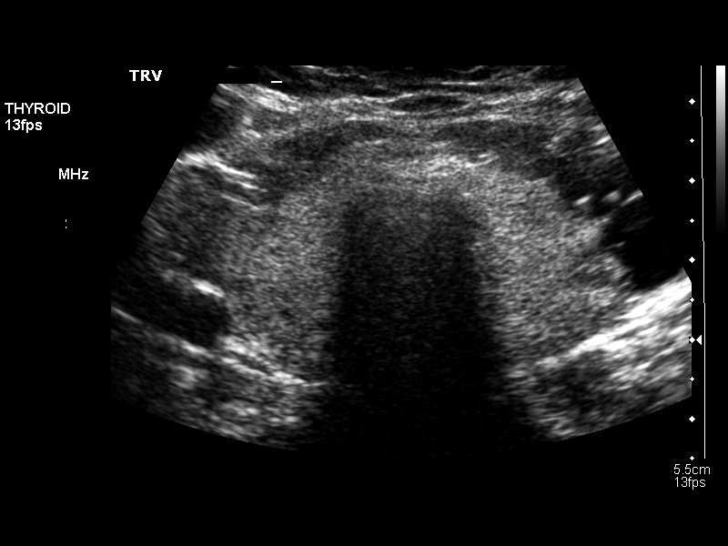
[im 7/75]
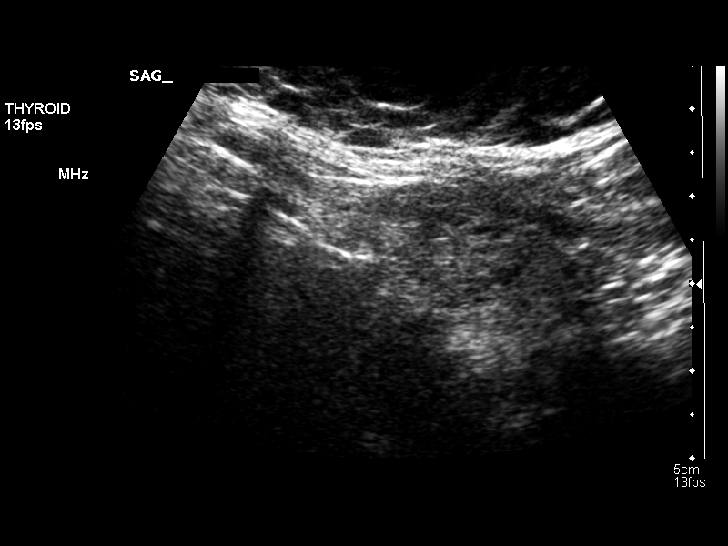
[im 13/75]
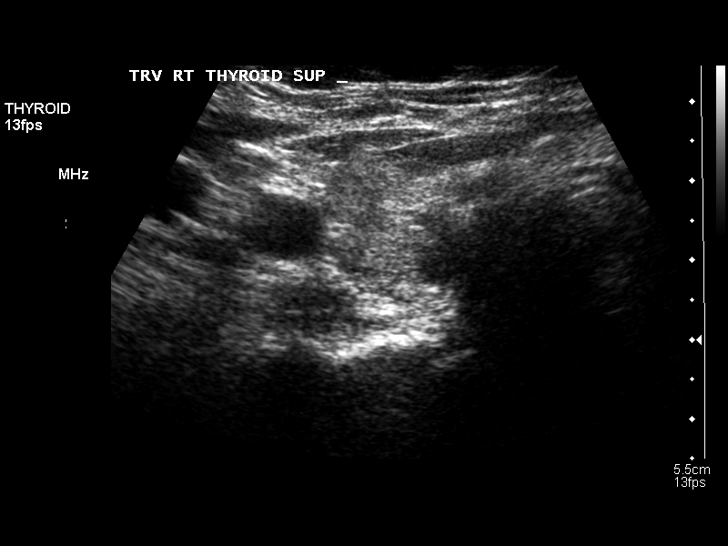
[im 19/75]
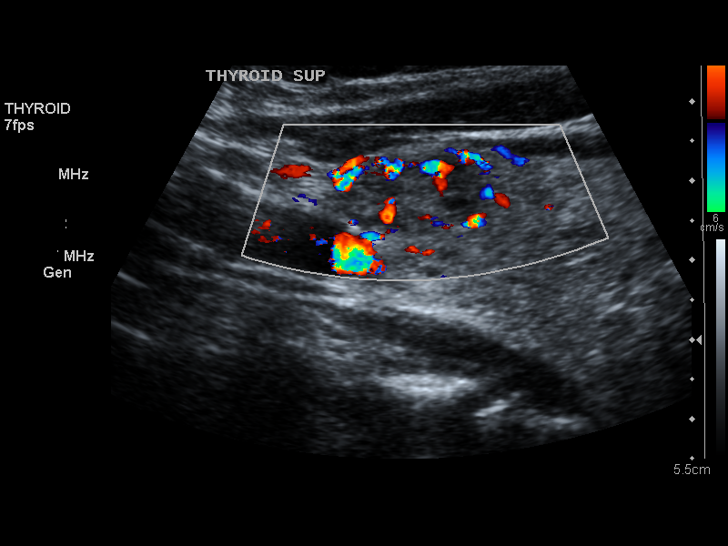
[im 25/75]
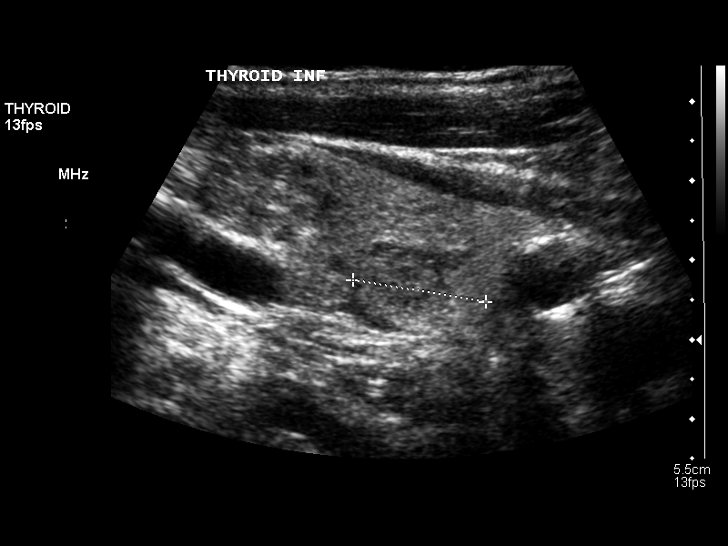
[im 31/75]
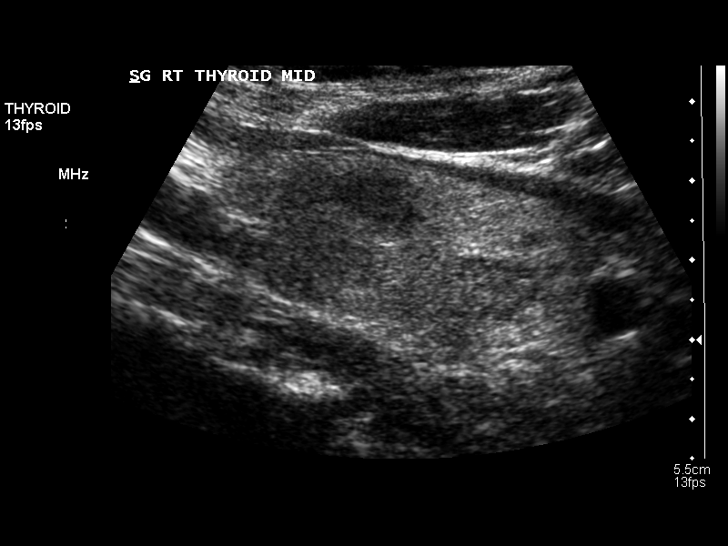
[im 38/75]
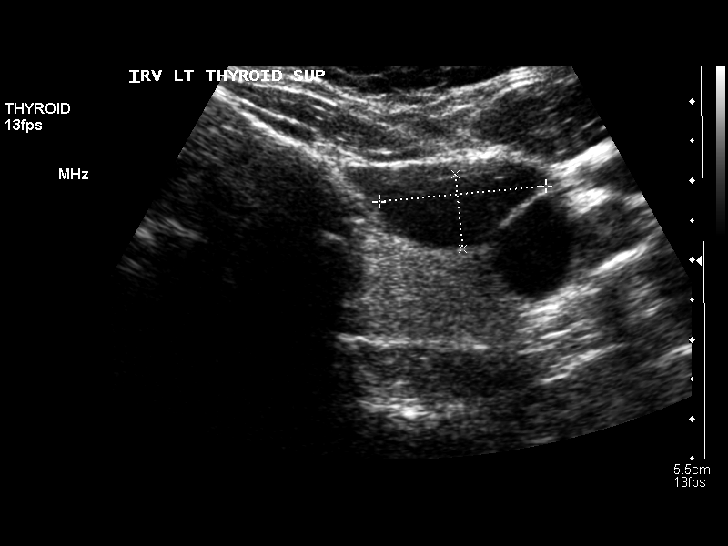
[im 44/75]
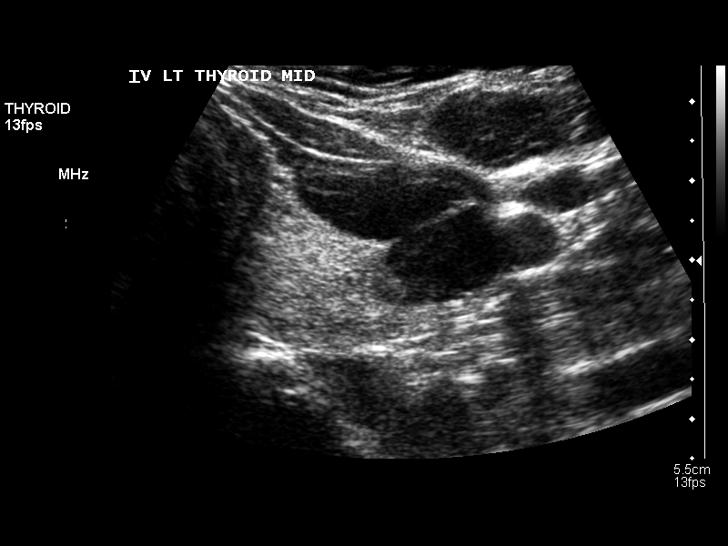
[im 50/75]
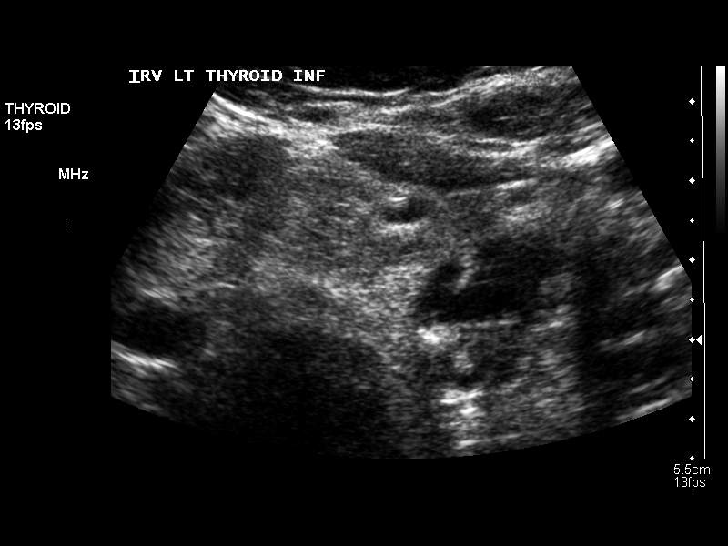
[im 56/75]
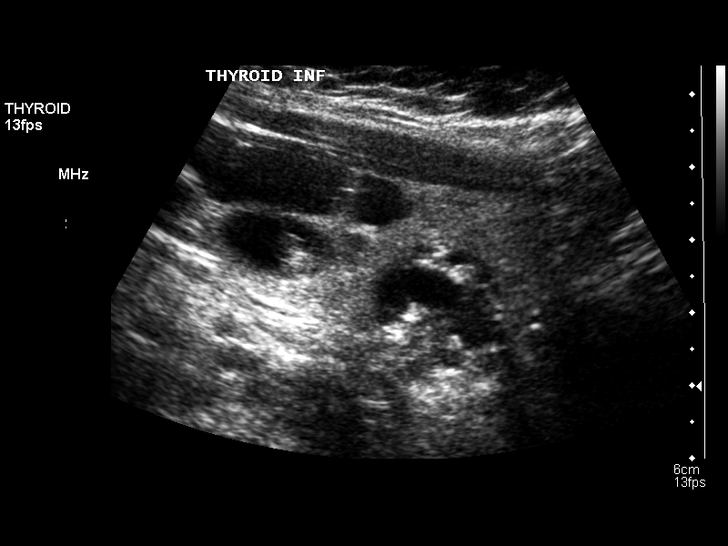
[im 62/75]
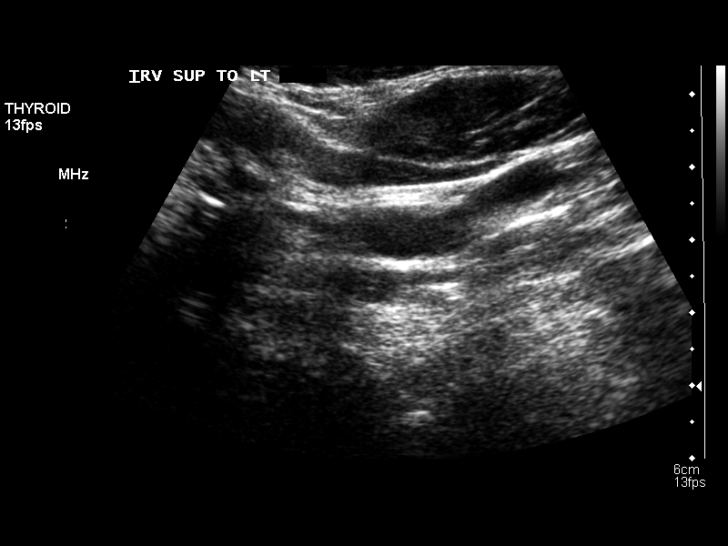
[im 68/75]
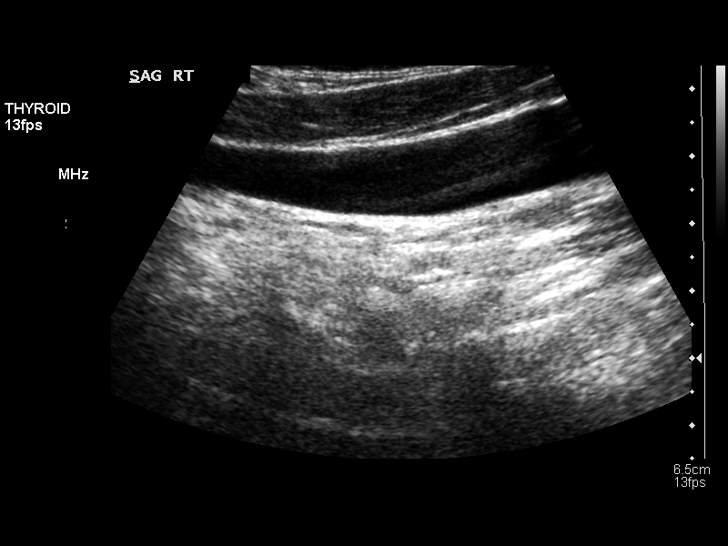
[im 75/75]
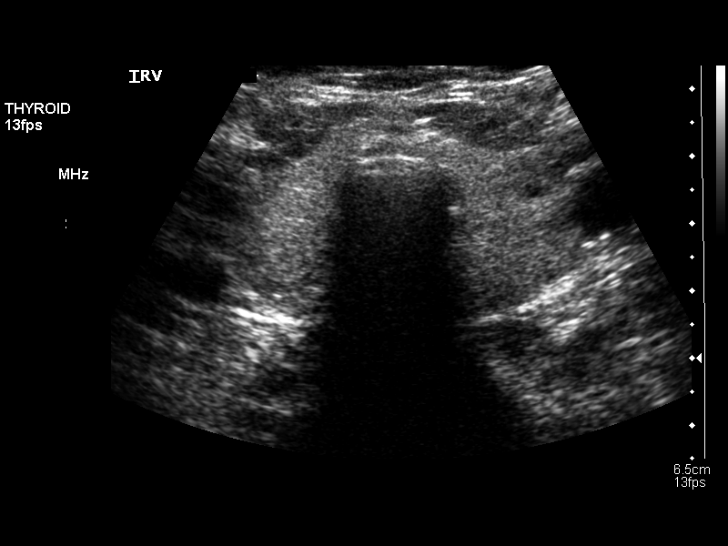

[13 of 25 positions shown; findings below may reference images not displayed]

FINDINGS: There is diffuse heterogeneity of the thyroid parenchyma. No new
discrete thyroid nodules.

Right thyroid lobe

Measurements: Borderline enlarged measuring 5.0 x 2.3 x 3.2 cm,
unchanged, previously, 5.4 x 2.0 x 2.8 cm.

*Right, superior - 1.4 x 1.1 x 2.0 cm - mixed echogenic, solid -
minimally increased in size in interval, previously, 1.6 x 1.2 x
cm.

Right, mid - 0.5 cm - mixed echogenic, solid.

Right, inferior - 1.3 x 1.0 x 1.7 cm - mixed echogenic, ill-defined
nodule measured approximately 1.7 x 1.0 x 1.1 cm.

Right, inferior - 0.4 cm - hypoechoic, solid

Left thyroid lobe

Measurements: Borderline enlarged measuring 6.4 x 3.2 x 3.6 cm,
unchanged, previously, 6.5 x 3.1 x 2.8 cm. No nodules visualized.

Left, superior - 2.1 x 0.9 x 2.5 cm - hypoechoic, likely cystic -
grossly unchanged, previously, 2.2 x 0.9 x 1.9 cm.

Left, mid - 1.6 x 1.2 x 1.9 cm - minimally increased in size in
interval, previously, 0.9 x 0.9 x 1.1 cm.

Left, inferior - 0.6 cm - anechoic with peripheral echogenic nodule
with ring down artifact suggestive of colloid.

Left, inferior, posterior - 0.8 cm - hypoechoic, likely cystic.

Left, inferior - 2.2 x 2.3 x 2.5 cm - mixed echogenic, largely
anechoic with multiple internal echogenic foci suggestive of
calcifications, ill-defined - grossly unchanged since the 8148
examination at which time the nodule measured approximately 2.2 x
2.1 x 2.5 cm.

Isthmus

Thickness: Enlarged measuring 0.8 cm in diameter, unchanged.

Left, inferior - 1.6 x 1.5 x 1.8 cm - mixed echogenic, solid -
nodule measured approximately 1.7 x 1.3 x 1.5 cm, with slight
differences attributable to scan plane.

Lymphadenopathy

None visualized.
IMPRESSION: Similar findings of multi nodular goiter. No new thyroid nodules. A
dominant now approximately 2 cm nodule within the superior aspect of
the right lobe of the thyroid has minimally increased in size in
interval, previously, 1.6 cm. As such, this nodule meets imaging
criteria for percutaneous sampling as clinically indicated. This
recommendation follows the consensus statement: Management of
Thyroid Nodules Detected at US: Society of Radiologists in
Ultrasound Consensus Conference Statement. Radiology 3883;

## 2016-07-31 ENCOUNTER — Telehealth: Payer: Self-pay | Admitting: *Deleted

## 2016-07-31 NOTE — Telephone Encounter (Signed)
Received faxed refill from mail order pharmacy for Potassium 10 meg. Records show that patient has moved. From from mail order pharmacy is on your desk. Should script be refilled?

## 2016-08-01 NOTE — Telephone Encounter (Signed)
Rx request faxed back to mail order pharmacy advising that [place] has relocated to [facility] and future requests should go to new MD.

## 2016-08-01 NOTE — Telephone Encounter (Signed)
Needs to come through new MD.  Thanks.  Let me know if you need the form back.
